# Patient Record
Sex: Male | Born: 1970 | Race: Black or African American | Hispanic: No | Marital: Married | Smoking: Former smoker
Health system: Southern US, Community
[De-identification: ages and names within clinical notes are randomized; demographics above are authoritative.]

## PROBLEM LIST (undated history)

## (undated) DIAGNOSIS — Z789 Other specified health status: Secondary | ICD-10-CM

## (undated) DIAGNOSIS — I10 Essential (primary) hypertension: Secondary | ICD-10-CM

## (undated) HISTORY — DX: Essential (primary) hypertension: I10

## (undated) HISTORY — PX: NO PAST SURGERIES: SHX2092

---

## 2006-11-13 ENCOUNTER — Emergency Department: Payer: Self-pay | Admitting: Emergency Medicine

## 2008-11-07 ENCOUNTER — Emergency Department: Payer: Self-pay | Admitting: Emergency Medicine

## 2008-11-12 ENCOUNTER — Emergency Department: Payer: Self-pay | Admitting: Emergency Medicine

## 2008-11-18 ENCOUNTER — Emergency Department: Payer: Self-pay | Admitting: Emergency Medicine

## 2009-04-09 ENCOUNTER — Emergency Department: Payer: Self-pay | Admitting: Emergency Medicine

## 2010-05-26 ENCOUNTER — Emergency Department: Payer: Self-pay | Admitting: Emergency Medicine

## 2014-02-07 ENCOUNTER — Emergency Department: Payer: Self-pay | Admitting: Emergency Medicine

## 2015-10-29 ENCOUNTER — Encounter (HOSPITAL_COMMUNITY): Payer: Self-pay | Admitting: *Deleted

## 2015-10-29 ENCOUNTER — Emergency Department (HOSPITAL_COMMUNITY)
Admission: EM | Admit: 2015-10-29 | Discharge: 2015-10-29 | Disposition: A | Discharge status: Home / Self Care | Payer: Self-pay | Attending: Emergency Medicine | Admitting: Emergency Medicine

## 2015-10-29 ENCOUNTER — Emergency Department (HOSPITAL_COMMUNITY): Payer: Self-pay

## 2015-10-29 DIAGNOSIS — S39012A Strain of muscle, fascia and tendon of lower back, initial encounter: Secondary | ICD-10-CM | POA: Insufficient documentation

## 2015-10-29 DIAGNOSIS — Y998 Other external cause status: Secondary | ICD-10-CM | POA: Insufficient documentation

## 2015-10-29 DIAGNOSIS — X58XXXA Exposure to other specified factors, initial encounter: Secondary | ICD-10-CM | POA: Insufficient documentation

## 2015-10-29 DIAGNOSIS — Y9289 Other specified places as the place of occurrence of the external cause: Secondary | ICD-10-CM | POA: Insufficient documentation

## 2015-10-29 DIAGNOSIS — Y9301 Activity, walking, marching and hiking: Secondary | ICD-10-CM | POA: Insufficient documentation

## 2015-10-29 DIAGNOSIS — F17210 Nicotine dependence, cigarettes, uncomplicated: Secondary | ICD-10-CM | POA: Insufficient documentation

## 2015-10-29 LAB — URINALYSIS, ROUTINE W REFLEX MICROSCOPIC
BILIRUBIN URINE: NEGATIVE
Glucose, UA: NEGATIVE mg/dL
Hgb urine dipstick: NEGATIVE
Ketones, ur: NEGATIVE mg/dL
Leukocytes, UA: NEGATIVE
NITRITE: NEGATIVE
Protein, ur: NEGATIVE mg/dL
SPECIFIC GRAVITY, URINE: 1.023 (ref 1.005–1.030)
pH: 6 (ref 5.0–8.0)

## 2015-10-29 MED ORDER — OXYCODONE-ACETAMINOPHEN 5-325 MG PO TABS
1.0000 | ORAL_TABLET | Freq: Once | ORAL | Status: AC
  Administered 2015-10-29: 1 via ORAL
  Filled 2015-10-29: qty 1

## 2015-10-29 MED ORDER — HYDROCODONE-ACETAMINOPHEN 5-325 MG PO TABS: 1.0000 | ORAL_TABLET | Freq: Four times a day (QID) | ORAL | Status: DC | PRN

## 2015-10-29 MED ORDER — METHOCARBAMOL 500 MG PO TABS: 500.0000 mg | ORAL_TABLET | Freq: Two times a day (BID) | ORAL | Status: DC

## 2015-10-29 MED ORDER — KETOROLAC TROMETHAMINE 60 MG/2ML IM SOLN
60.0000 mg | Freq: Once | INTRAMUSCULAR | Status: AC
  Administered 2015-10-29: 60 mg via INTRAMUSCULAR
  Filled 2015-10-29: qty 2

## 2015-10-29 MED ORDER — PREDNISONE 20 MG PO TABS: 40.0000 mg | ORAL_TABLET | Freq: Every day | ORAL | Status: DC

## 2015-10-29 NOTE — Discharge Instructions (Signed)
Take ibuprofen or tylenol for minor pain. norco for severe pain. Prednisone for inflammation until all gone. Robaxin for spasms. Try heating pads. Gentle stretches. Follow up with primary care doctor.    Back Exercises The following exercises strengthen the muscles that help to support the back. They also help to keep the lower back flexible. Doing these exercises can help to prevent back pain or lessen existing pain. If you have back pain or discomfort, try doing these exercises 2-3 times each day or as told by your health care provider. When the pain goes away, do them once each day, but increase the number of times that you repeat the steps for each exercise (do more repetitions). If you do not have back pain or discomfort, do these exercises once each day or as told by your health care provider. EXERCISES Single Knee to Chest Repeat these steps 3-5 times for each leg: 1. Lie on your back on a firm bed or the floor with your legs extended. 2. Bring one knee to your chest. Your other leg should stay extended and in contact with the floor. 3. Hold your knee in place by grabbing your knee or thigh. 4. Pull on your knee until you feel a gentle stretch in your lower back. 5. Hold the stretch for 10-30 seconds. 6. Slowly release and straighten your leg. Pelvic Tilt Repeat these steps 5-10 times: 1. Lie on your back on a firm bed or the floor with your legs extended. 2. Bend your knees so they are pointing toward the ceiling and your feet are flat on the floor. 3. Tighten your lower abdominal muscles to press your lower back against the floor. This motion will tilt your pelvis so your tailbone points up toward the ceiling instead of pointing to your feet or the floor. 4. With gentle tension and even breathing, hold this position for 5-10 seconds. Cat-Cow Repeat these steps until your lower back becomes more flexible: 1. Get into a hands-and-knees position on a firm surface. Keep your hands under  your shoulders, and keep your knees under your hips. You may place padding under your knees for comfort. 2. Let your head hang down, and point your tailbone toward the floor so your lower back becomes rounded like the back of a cat. 3. Hold this position for 5 seconds. 4. Slowly lift your head and point your tailbone up toward the ceiling so your back forms a sagging arch like the back of a cow. 5. Hold this position for 5 seconds. Press-Ups Repeat these steps 5-10 times: 1. Lie on your abdomen (face-down) on the floor. 2. Place your palms near your head, about shoulder-width apart. 3. While you keep your back as relaxed as possible and keep your hips on the floor, slowly straighten your arms to raise the top half of your body and lift your shoulders. Do not use your back muscles to raise your upper torso. You may adjust the placement of your hands to make yourself more comfortable. 4. Hold this position for 5 seconds while you keep your back relaxed. 5. Slowly return to lying flat on the floor. Bridges Repeat these steps 10 times: 1. Lie on your back on a firm surface. 2. Bend your knees so they are pointing toward the ceiling and your feet are flat on the floor. 3. Tighten your buttocks muscles and lift your buttocks off of the floor until your waist is at almost the same height as your knees. You should feel the  muscles working in your buttocks and the back of your thighs. If you do not feel these muscles, slide your feet 1-2 inches farther away from your buttocks. 4. Hold this position for 3-5 seconds. 5. Slowly lower your hips to the starting position, and allow your buttocks muscles to relax completely. If this exercise is too easy, try doing it with your arms crossed over your chest. Abdominal Crunches Repeat these steps 5-10 times: 1. Lie on your back on a firm bed or the floor with your legs extended. 2. Bend your knees so they are pointing toward the ceiling and your feet are flat on  the floor. 3. Cross your arms over your chest. 4. Tip your chin slightly toward your chest without bending your neck. 5. Tighten your abdominal muscles and slowly raise your trunk (torso) high enough to lift your shoulder blades a tiny bit off of the floor. Avoid raising your torso higher than that, because it can put too much stress on your low back and it does not help to strengthen your abdominal muscles. 6. Slowly return to your starting position. Back Lifts Repeat these steps 5-10 times: 1. Lie on your abdomen (face-down) with your arms at your sides, and rest your forehead on the floor. 2. Tighten the muscles in your legs and your buttocks. 3. Slowly lift your chest off of the floor while you keep your hips pressed to the floor. Keep the back of your head in line with the curve in your back. Your eyes should be looking at the floor. 4. Hold this position for 3-5 seconds. 5. Slowly return to your starting position. SEEK MEDICAL CARE IF:  Your back pain or discomfort gets much worse when you do an exercise.  Your back pain or discomfort does not lessen within 2 hours after you exercise. If you have any of these problems, stop doing these exercises right away. Do not do them again unless your health care provider says that you can. SEEK IMMEDIATE MEDICAL CARE IF:  You develop sudden, severe back pain. If this happens, stop doing the exercises right away. Do not do them again unless your health care provider says that you can.   This information is not intended to replace advice given to you by your health care provider. Make sure you discuss any questions you have with your health care provider.   Document Released: 10/25/2004 Document Revised: 06/08/2015 Document Reviewed: 11/11/2014 Elsevier Interactive Patient Education 2016 [employer].   Emergency Department Resource Guide 1) Find a Doctor and Pay Out of Pocket Although you won't have to find out who is covered by your  insurance plan, it is a good idea to ask around and get recommendations. You will then need to call the office and see if the doctor you have chosen will accept you as a new patient and what types of options they offer for patients who are self-pay. Some doctors offer discounts or will set up payment plans for their patients who do not have insurance, but you will need to ask so you aren't surprised when you get to your appointment.  2) Contact Your Local Health Department Not all health departments have doctors that can see patients for sick visits, but many do, so it is worth a call to see if yours does. If you don't know where your local health department is, you can check in your phone book. The CDC also has a tool to help you locate your state's health department, and  many state websites also have listings of all of their local health departments.  3) Find a Walk-in Clinic If your illness is not likely to be very severe or complicated, you may want to try a walk in clinic. These are popping up all over the country in pharmacies, drugstores, and shopping centers. They're usually staffed by nurse practitioners or physician assistants that have been trained to treat common illnesses and complaints. They're usually fairly quick and inexpensive. However, if you have serious medical issues or chronic medical problems, these are probably not your best option.  No Primary Care Doctor: - Call Health Connect at  [phone] - they can help you locate a primary care doctor that  accepts your insurance, provides certain services, etc. - Physician Referral Service- [phone]  Chronic Pain Problems: Organization         Address  Phone   Notes  [facility]  [phone] Patients need to be referred by their primary care doctor.   Medication Assistance: Organization         Address  Phone   Notes  [facility] Medication [facility] [address] [place] [place], [place] [address] [phone] --Must be a resident of [facility] -- Must have NO insurance coverage whatsoever (no Medicaid/ Medicare, etc.) -- The pt. MUST have a primary care doctor that directs their care regularly and follows them in the community   Med[facility]ist  [phone]   [employer]  [phone]    Agencies that provide inexpensive medical care: Organization         Address  Phone   Notes  [facility]  [phone]   [facility]    [phone]   [facility] [address] [place], [place] [address] [phone]   [facility] [address] [place] [place] [phone]   Planned Parenthood    [phone]   [facility]    [phone]   [facility]  [address], [place] Phone:  [phone], Fax:  [phone] Hours of Operation:  9 am - 6 pm, M-F.  Also accepts Medicaid/Medicare and self-pay.  [facility]  [address], [place] Phone: [phone], Fax: [phone]. Hours of Operation:  8:30 am - 5:30 pm, M-F.  Also accepts Medicaid and self-pay.  [facility] [place] [address], [place] Phone: [phone]   [facility] [address] Natasha Bence [place], [place] [phone], Ext. [phone] Mondays & Thursdays: 7-9 AM.  First 15 patients are seen on a first come, first serve basis.    Medicaid-accepting [facility] Providers:  Organization         Address  Phone   Notes  [facility] [address], [place] [phone] Also accepts self-pay patients.  [facility] [address] Laurell Josephs [place], [place]  [phone]   [facility] [address], [place] [phone]   [facility] [address], [place] [phone]   Renaye Rakers [address], [place]   [phone] Only accepts [place] patients after they have their name applied to their card.   Self-Pay (no insurance) in [facility]:  Organization  Address  Phone   Notes  Sickle Cell Patients, [facility] [address] [place], [place] [phone]   [facility] [address] [place], [place] [phone]   [facility] [place]  [address], [place] [phone]   [facility]/Dr. Osei-Bonsu  [address] [place] or [address], [place] [phone] Phone number for both [place] and [place] locations is the same.  [facility] [address], [place] [phone]   [facility] [address] [place] or [address] [phone]   [facility] [address], [place] [phone], phone; [phone], fax Sees patients 1st and 3rd Saturday of every month.  Must not qualify for public or private insurance (i.e. Medicaid, Medicare, [place] Health Choice, Veterans' Benefits)  Household income should be no more than 200% of the poverty level The clinic cannot treat you if you are pregnant or think you are pregnant  Sexually transmitted diseases are not treated at the clinic.    Dental Care: Organization         Address  Phone  Notes  [facility] [address] [place], [place] [phone] Accepts children up to age 3 who are enrolled in [place] or [place] Health Choice; pregnant women with a Medicaid card; and children who have applied for Medicaid or [place] Health Choice, but were declined, whose parents can pay a reduced fee at time of service.  [facility]  [address], [place] [phone] Accepts children up to age 61 who are enrolled  in [place] or [place] Health Choice; pregnant women with a Medicaid card; and children who have applied for Medicaid or [place] Health Choice, but were declined, whose parents can pay a reduced fee at time of service.  [facility]  [address] [place], [place] [phone] Patients are seen by appointment only. Walk-ins are not accepted. [facility] will see patients 69 years of age and older. Monday - Tuesday (8am-5pm) Most Wednesdays (8:30-5pm) $30 per visit, cash only  [facility]  [address], [facility] [phone] Patients are seen by appointment only. Walk-ins are not accepted. [facility] will see patients 88 years of age and older. One Wednesday Evening (Monthly: Volunteer Based).  $30 per visit, cash only  [employer]  [phone] for adults; Children under age 53, call Graduate Pediatric Dentistry at [phone]. Children aged 65-14, please call [phone] to request a pediatric application.  Dental services are provided in all areas of dental care including fillings, crowns and bridges, complete and partial dentures, implants, gum treatment, root canals, and extractions. Preventive care is also provided. Treatment is provided to both adults and children. Patients are selected via a lottery and there is often a waiting list.   [facility] [address], [place]  [phone] [email or web address]   [facility] [address] [place], [place] [phone], Ext. [phone] Second and Fourth Thursday of each month, opens at 6:30 AM; Clinic ends at 9 AM.  Patients are seen on a first-come first-served basis, and a limited number are seen during each clinic.   [facility]  [address] [place],  [place] [phone]   Eligibility Requirements You must have lived in [place], [place], or [place] counties for at least the last three months.   You cannot be  eligible for state or federal sponsored [place]healthcare insurance, including CIGNAVeterans Administration, [place]icaid, or [employer]icare.   You generally cannot be eligible for healthcare insurance through your employer.    How to apply: Eligibility screenings are held every Tuesday and Wednesday afternoon from 1:00 pm until 4:00 pm. You do not need an appointment for the interview!  [facility] [address], [place], [place] [phone]   [facility]  [phone]   [facility]  [phone]   [facility]  [phone]    Behavioral Health Resources in the Community: Intensive Outpatient Programs Organization         Address  Phone  Notes  [facility] [address], [place], [place] [phone]   [facility] [address], [place], [place] [phone]   [facility] [address], [place], [place]  [phone]   [facility] [address],  [place], [place] [phone] or [phone]   Substance Abuse Resources Organization         Address  Phone  Notes  [facility]  [phone]   [facility]  [phone]   [facility]  [phone]   [facility]  [phone]   Residential & Outpatient Substance Abuse Program  [phone]   Psychological Services Organization         Address  Phone  Notes  [facility]  [phone]   [facility]  [phone]- [phone]   [facility] [address], [place] [phone] or [phone]    Mobile Crisis Teams Organization         Address  Phone  Notes  [facility], Mobile Crisis Care Unit  [phone]   [facility]  [address] [place], [place] [phone]   Doristine LocksSharon DeEsch [address] [place] [phone]    Self-Help/Support Groups Organization          Address  Phone             Notes  [facility] [place] - variety of support groups  [phone] Call for more information  Narcotics Anonymous (NA), [facility] [address], [place]  2 meetings at this location   Statisticianesidential Treatment Programs Organization         Address  Phone  Notes  [facility] [address],    [place]  [phone]   [facility]  [address], [place] [phone], [place], [place] [phone]   [facility] [address], [place] [phone] Admissions: 8am-3pm M-F  [facility] [address]    [place], [place] [phone]   [facility] [address], [place], [place] [phone]   [facility] [address]  [place], [place] [phone]   [facility] - Intensive Outpatient [address], [place], [place] [phone]   [facility] [address]  [place], [place] [phone] or [phone]   [facility] ([facility]) [address] [place], [place] [phone] Accepts Medicaid  [facility] [address]  [place] [phone] Substance Abuse/Addiction Treatment   [facility] Resources Organization  Address  Phone  Notes  [facility]  [phone]   Angie Fava, PhD [address] Ervin Knack [place], [place]   [phone] or [phone]   [facility]   [address] [place], [place] [phone]   [facility] [address] [place], [place] [phone] Insurance/Medicaid/sponsorship through [facility] and Families [address]                                    [place], [place] [phone] Therapy/tele-psych/case  [facility] [address], [place] [phone]    Dr. Lolly Mustache  [phone]   [facility]  [employer]  [facility] Dept. 1) [address], [place] 2) [address] [place] 3)  [address], [place] [phone]  [phone]   [facility] Child Abuse Hotline [phone] or [phone] (After Hours)

## 2015-10-29 NOTE — ED Notes (Signed)
Pt reports onset today of mid lower back pain, increases with movement. Denies injury, denies any urinary symptoms. Ambulatory at triage.

## 2015-10-29 NOTE — ED Provider Notes (Signed)
CSN: [id number]     Arrival date & time 10/29/15  1734 History   First MD Initiated Contact with Patient 10/29/15 1740     Chief Complaint  Patient presents with  . Back Pain     (Consider location/radiation/quality/duration/timing/severity/associated sxs/prior Treatment) HPI Kyle Mathis is a 45 y.o. male with no medical problems, presents to emergency department with acute onset of lower back pain. Patient states he is walking and leaned over to pick something off the floor when he suddenly developed severe pain in the lower back. States pain radiates into the right side of the back. States pain is worsened with movement and walking. He states that nothing makes it better. He denies any prior back issues or prior similar back pain. He denies pain radiating to his abdomen or down his extremities. He denies any abdominal pain, nausea, vomiting. He denies any dysuria, hematuria, urgency or frequency. He denies any numbness or weakness in his legs or arms. No trouble controlling his bowels or bladder. No fever. He took some ibuprofen at home but states it did not help.  History reviewed. No pertinent past medical history. History reviewed. No pertinent past surgical history. History reviewed. No pertinent family history. Social History  Substance Use Topics  . Smoking status: Current Every Day Smoker    Types: Cigarettes  . Smokeless tobacco: None  . Alcohol Use: No    Review of Systems  Constitutional: Negative for fever and chills.  Respiratory: Negative for cough, chest tightness and shortness of breath.   Cardiovascular: Negative for chest pain, palpitations and leg swelling.  Gastrointestinal: Negative for nausea, vomiting, abdominal pain, diarrhea and abdominal distention.  Genitourinary: Negative for dysuria, urgency, frequency and hematuria.  Musculoskeletal: Positive for back pain and arthralgias. Negative for myalgias, gait problem, neck pain and neck stiffness.  Skin:  Negative for rash.  Allergic/Immunologic: Negative for immunocompromised state.  Neurological: Negative for dizziness, weakness, light-headedness, numbness and headaches.  All other systems reviewed and are negative.     Allergies  Review of patient's allergies indicates no known allergies.  Home Medications   Prior to Admission medications   Not on File   BP 143/87 mmHg  Pulse 85  Temp(Src) 97.8 F (36.6 C) (Oral)  Resp 18  SpO2 97% Physical Exam  Constitutional: He is oriented to person, place, and time. He appears well-developed and well-nourished. No distress.  HENT:  Head: Normocephalic and atraumatic.  Eyes: Conjunctivae are normal.  Neck: Neck supple.  Cardiovascular: Normal rate, regular rhythm and normal heart sounds.   Pulmonary/Chest: Effort normal. No respiratory distress. He has no wheezes. He has no rales.  Abdominal: Soft. Bowel sounds are normal. He exhibits no distension. There is no tenderness. There is no rebound.  Musculoskeletal: He exhibits no edema.  Midline lumbar spine tenderness. The right paravertebral tenderness. Pain with right straight leg raise.  Neurological: He is alert and oriented to person, place, and time.  5/5 and equal lower extremity strength. 2+ and equal patellar reflexes bilaterally. Pt able to dorsiflex bilateral toes and feet with good strength against resistance. Equal sensation bilaterally over thighs and lower legs.   Skin: Skin is warm and dry.  Nursing note and vitals reviewed.   ED Course  Procedures (including critical care time) Labs Review Labs Reviewed  URINALYSIS, ROUTINE W REFLEX MICROSCOPIC (NOT AT [facility])    Imaging Review Dg Lumbar Spine Complete  10/29/2015  CLINICAL DATA:  Back injury and pain after bending over to pick up  object. Initial encounter. EXAM: LUMBAR SPINE - COMPLETE 4+ VIEW COMPARISON:  None. FINDINGS: There is no evidence of lumbar spine fracture. Alignment is normal. Moderate to severe  degenerative disc disease is seen at L4-5. Mild degenerative disc disease seen at L5-S1. Bilateral facet DJD also seen at these levels. No focal lytic sclerotic bone lesions identified. IMPRESSION: No acute findings.  Degenerative spondylosis, as described above. Electronically Signed   By: Myles Rosenthal M.D.   On: 10/29/2015 19:07   I have personally reviewed and evaluated these images and lab results as part of my medical decision-making.   EKG Interpretation None      MDM   Final diagnoses:  Lumbosacral strain, initial encounter    patient with lower back pain, acute onset today while leaning forward. Pain most likely musculoskeletal based on exam and history, however patient is concerned about his kidneys. We'll get urinalysis. Will also get x-ray to evaluate patient's spine for any degenerative changes or disc space narrowing.   X-ray showing degenerative spondylosis. Urinalysis is normal. Pt given toradol  IM and percocet for severe pain. Patient is feeling better. He is neurovascularly intact. No evidence of cauda equina. We'll try treating with prednisone, Norco, Robaxin. Follow with primary care doctor. Return precautions discussed.  Filed Vitals:   10/29/15 1739  BP: 143/87  Pulse: 85  Temp: 97.8 F (36.6 C)  TempSrc: Oral  Resp: 18  SpO2: 97%  \  Jaynie Crumble, PA-C 10/30/15 1449  Laurence Spates, MD 10/30/15 1556

## 2017-07-13 ENCOUNTER — Encounter (HOSPITAL_COMMUNITY): Payer: Self-pay | Admitting: *Deleted

## 2017-07-13 ENCOUNTER — Emergency Department (HOSPITAL_COMMUNITY)
Admission: EM | Admit: 2017-07-13 | Discharge: 2017-07-14 | Disposition: A | Discharge status: Home / Self Care | Payer: Self-pay | Attending: Emergency Medicine | Admitting: Emergency Medicine

## 2017-07-13 DIAGNOSIS — R066 Hiccough: Secondary | ICD-10-CM

## 2017-07-13 DIAGNOSIS — R197 Diarrhea, unspecified: Secondary | ICD-10-CM | POA: Insufficient documentation

## 2017-07-13 DIAGNOSIS — R112 Nausea with vomiting, unspecified: Secondary | ICD-10-CM | POA: Insufficient documentation

## 2017-07-13 DIAGNOSIS — Z87891 Personal history of nicotine dependence: Secondary | ICD-10-CM | POA: Insufficient documentation

## 2017-07-13 MED ORDER — SODIUM CHLORIDE 0.9 % IV SOLN
25.0000 mg | Freq: Once | INTRAVENOUS | Status: AC
  Administered 2017-07-14: 25 mg via INTRAVENOUS
  Filled 2017-07-13: qty 1

## 2017-07-13 MED ORDER — SODIUM CHLORIDE 0.9 % IV BOLUS (SEPSIS)
500.0000 mL | Freq: Once | INTRAVENOUS | Status: AC
  Administered 2017-07-14: 500 mL via INTRAVENOUS

## 2017-07-13 NOTE — ED Triage Notes (Signed)
Pt c/o hiccups, sob, throat irritation, n/v/d, chills,  for the past 3 days, pt had recent flight from [place] to [place] 3 days ago.

## 2017-07-14 ENCOUNTER — Emergency Department (HOSPITAL_COMMUNITY): Payer: Self-pay

## 2017-07-14 LAB — COMPREHENSIVE METABOLIC PANEL
ALBUMIN: 3.9 g/dL (ref 3.5–5.0)
ALK PHOS: 66 U/L (ref 38–126)
ALT: 24 U/L (ref 17–63)
AST: 30 U/L (ref 15–41)
Anion gap: 10 (ref 5–15)
BILIRUBIN TOTAL: 0.8 mg/dL (ref 0.3–1.2)
BUN: 9 mg/dL (ref 6–20)
CO2: 26 mmol/L (ref 22–32)
Calcium: 9.1 mg/dL (ref 8.9–10.3)
Chloride: 101 mmol/L (ref 101–111)
Creatinine, Ser: 1.15 mg/dL (ref 0.61–1.24)
GFR calc Af Amer: >60 mL/min (ref >60)
GLUCOSE: 106 mg/dL — AB (ref 65–99)
Potassium: 3.4 mmol/L — ABNORMAL LOW (ref 3.5–5.1)
Sodium: 137 mmol/L (ref 135–145)
TOTAL PROTEIN: 7.4 g/dL (ref 6.5–8.1)

## 2017-07-14 LAB — CBC WITH DIFFERENTIAL/PLATELET
BASOS ABS: 0 10*3/uL (ref 0.0–0.1)
BASOS PCT: 0 %
Eosinophils Absolute: 0.5 10*3/uL (ref 0.0–0.7)
Eosinophils Relative: 4 %
HEMATOCRIT: 36.5 % — AB (ref 39.0–52.0)
HEMOGLOBIN: 12.7 g/dL — AB (ref 13.0–17.0)
LYMPHS ABS: 3.3 10*3/uL (ref 0.7–4.0)
Lymphocytes Relative: 29 %
MCH: 32.9 pg (ref 26.0–34.0)
MCHC: 34.8 g/dL (ref 30.0–36.0)
MCV: 94.6 fL (ref 78.0–100.0)
MONO ABS: 1.2 10*3/uL — AB (ref 0.1–1.0)
Monocytes Relative: 10 %
NEUTROS PCT: 57 %
Neutro Abs: 6.4 10*3/uL (ref 1.7–7.7)
Platelets: 200 10*3/uL (ref 150–400)
RBC: 3.86 MIL/uL — ABNORMAL LOW (ref 4.22–5.81)
RDW: 12.3 % (ref 11.5–15.5)
WBC: 11.3 10*3/uL — AB (ref 4.0–10.5)

## 2017-07-14 LAB — LIPASE, BLOOD: LIPASE: 49 U/L (ref 11–51)

## 2017-07-14 LAB — I-STAT TROPONIN, ED: Troponin i, poc: 0 ng/mL (ref 0.00–0.08)

## 2017-07-14 MED ORDER — RANITIDINE HCL 150 MG PO TABS: 150.0000 mg | ORAL_TABLET | Freq: Two times a day (BID) | ORAL | 0 refills | Status: DC

## 2017-07-14 MED ORDER — CHLORPROMAZINE HCL 50 MG/2ML IJ SOLN
INTRAMUSCULAR | Status: AC
  Filled 2017-07-14: qty 2

## 2017-07-14 MED ORDER — ONDANSETRON HCL 4 MG PO TABS: 4.0000 mg | ORAL_TABLET | Freq: Four times a day (QID) | ORAL | 0 refills | Status: DC

## 2017-07-14 NOTE — ED Provider Notes (Signed)
[facility]-EMER[facility]ENCY DEPT Provider Note   CSN: [id number] Arrival date & time: 07/13/17  2311     History   Chief Complaint Chief Complaint  Patient presents with  . Hiccups    HPI Kyle Mathis is a 46 y.o. male.  Patient presents to the ER for evaluation of nausea, vomiting, diarrhea area.symptoms have been ongoing for 3 days. Symptoms began when he was in [place]. He reports that he ate a meal and then started to feel ill. He started having nausea and then developed vomiting and diarrhea. Since then he has been experiencing persistent hiccups. He has throat irritation and feels short of breath because of uncontrolled hiccuping. He is not experiencing any chest pain.      History reviewed. No pertinent past medical history.  There are no active problems to display for this patient.   History reviewed. No pertinent surgical history.     Home Medications    Prior to Admission medications   Medication Sig Start Date End Date Taking? Authorizing Provider  HYDROcodone-acetaminophen (NORCO) 5-325 MG tablet Take 1 tablet by mouth every 6 (six) hours as needed. 10/29/15   Kirichenko, Tatyana, PA-C  methocarbamol (ROBAXIN) 500 MG tablet Take 1 tablet (500 mg total) by mouth 2 (two) times daily. 10/29/15   Kirichenko, Lemont Fillers, PA-C  predniSONE (DELTASONE) 20 MG tablet Take 2 tablets (40 mg total) by mouth daily. 10/29/15   Jaynie Crumble, PA-C    Family History No family history on file.  Social History Social History  Substance Use Topics  . Smoking status: Former Smoker    Types: Cigarettes  . Smokeless tobacco: Never Used  . Alcohol use No     Allergies   Patient has no known allergies.   Review of Systems Review of Systems  Gastrointestinal: Positive for abdominal pain, diarrhea, nausea and vomiting.  All other systems reviewed and are negative.    Physical Exam Updated Vital Signs BP 131/71   Pulse 80   Temp 98.6 F (37 C) (Oral)   Resp 18   Ht   (1.753 m)   Wt 100.7 kg (222 lb)   SpO2 95%   BMI 32.78 kg/m   Physical Exam  Constitutional: He is oriented to person, place, and time. He appears well-developed and well-nourished. No distress.  HENT:  Head: Normocephalic and atraumatic.  Right Ear: Hearing normal.  Left Ear: Hearing normal.  Nose: Nose normal.  Mouth/Throat: Oropharynx is clear and moist and mucous membranes are normal.  Eyes: Pupils are equal, round, and reactive to light. Conjunctivae and EOM are normal.  Neck: Normal range of motion. Neck supple.  Cardiovascular: Regular rhythm, S1 normal and S2 normal.  Exam reveals no gallop and no friction rub.   No murmur heard. Pulmonary/Chest: Effort normal and breath sounds normal. No respiratory distress. He exhibits no tenderness.  Abdominal: Soft. Normal appearance and bowel sounds are normal. There is no hepatosplenomegaly. There is generalized tenderness. There is no rebound, no guarding, no tenderness at McBurney's point and negative Murphy's sign. No hernia.  Musculoskeletal: Normal range of motion.  Neurological: He is alert and oriented to person, place, and time. He has normal strength. No cranial nerve deficit or sensory deficit. Coordination normal. GCS eye subscore is 4. GCS verbal subscore is 5. GCS motor subscore is 6.  Skin: Skin is warm, dry and intact. No rash noted. No cyanosis.  Psychiatric: He has a normal mood and affect. His speech is normal and behavior is normal.  Thought content normal.  Nursing note and vitals reviewed.    ED Treatments / Results  Labs (all labs ordered are listed, but only abnormal results are displayed) Labs Reviewed  CBC WITH DIFFERENTIAL/PLATELET - Abnormal; Notable for the following:       Result Value   WBC 11.3 (*)    RBC 3.86 (*)    Hemoglobin 12.7 (*)    HCT 36.5 (*)    Monocytes Absolute 1.2 (*)    All other components within normal limits  COMPREHENSIVE METABOLIC PANEL - Abnormal; Notable for the  following:    Potassium 3.4 (*)    Glucose, Bld 106 (*)    All other components within normal limits  LIPASE, BLOOD  I-STAT TROPONIN, ED    EKG  EKG Interpretation  Date/Time:  Sunday July 14 2017 00:17:32 EDT Ventricular Rate:  85 PR Interval:    QRS Duration: 91 QT Interval:  358 QTC Calculation: 426 R Axis:   42 Text Interpretation:  Sinus rhythm Prolonged PR interval Probable anteroseptal infarct No significant change since last tracing Confirmed by Gilda Crease [phone]) on 07/14/2017 12:49:00 AM       Radiology Dg Abd Acute W/chest  Result Date: 07/14/2017 CLINICAL DATA:  Acute onset of nausea, vomiting, diarrhea and chills. Shortness of breath. Throat irritation. Initial encounter. EXAM: DG ABDOMEN ACUTE W/ 1V CHEST COMPARISON:  Lumbar spine radiographs performed 10/29/2015 FINDINGS: The lungs are hypoexpanded but appear grossly clear. There is no evidence of focal opacification, pleural effusion or pneumothorax. The cardiomediastinal silhouette is mildly enlarged. The visualized bowel gas pattern is unremarkable. Scattered stool and air are seen within the colon; there is no evidence of small bowel dilatation to suggest obstruction. No free intra-abdominal air is identified on the provided upright view. No acute osseous abnormalities are seen; the sacroiliac joints are unremarkable in appearance. IMPRESSION: 1. Unremarkable bowel gas pattern; no free intra-abdominal air seen. Small amount of stool noted in the colon. 2. Lungs hypoexpanded but grossly clear. 3. Mild cardiomegaly. Electronically Signed   By: Roanna Raider M.D.   On: 07/14/2017 04:29    Procedures Procedures (including critical care time)  Medications Ordered in ED Medications  sodium chloride 0.9 % bolus 500 mL (0 mLs Intravenous Stopped 07/14/17 0150)  chlorproMAZINE (THORAZINE) 25 mg in sodium chloride 0.9 % 25 mL IVPB (0 mg Intravenous Stopped 07/14/17 0150)     Initial Impression /  Assessment and Plan / ED Course  I have reviewed the triage vital signs and the nursing notes.  Pertinent labs & imaging results that were available during my care of the patient were reviewed by me and considered in my medical decision making (see chart for details).     Patient presented to the ER for evaluation of multiple problems. Patient began to have GI distress including nausea, vomiting, diarrhea well visiting in [place]. Once the GI symptoms began he started having hiccups and he has not been able to stop pickup since he started 3 days ago. He has not been able to eat or drink much because of his current symptoms. He has vomited multiple times and now his throat is irritated. He is not expressing chest pain or shortness of breath. No tachycardia, tachypnea, hypoxia. PERC negative, no signs of PE.  Patient had mild and diffuse abdominal tenderness, no focal tenderness. No guarding, no rebound.X-ray negative. Blood work reassuring. Patient treated with Thorazine and IV fluids. Hiccups resolved. Patient monitored for sedation after Thorazine, continues to  do well, appropriate for discharge with symptomatic treatment. Return if symptoms worsen.  Final Clinical Impressions(s) / ED Diagnoses   Final diagnoses:  Nausea vomiting and diarrhea  Hiccups    New Prescriptions New Prescriptions   No medications on file     Gilda Crease, MD 07/14/17 [phone]

## 2017-07-14 NOTE — ED Notes (Signed)
Pt and family updated,  

## 2017-07-14 NOTE — ED Notes (Signed)
Pt and family updated, pt drowsy, will arouse with stimulation,

## 2018-04-09 ENCOUNTER — Other Ambulatory Visit: Payer: Self-pay

## 2018-04-09 ENCOUNTER — Emergency Department: Payer: Self-pay

## 2018-04-09 ENCOUNTER — Emergency Department
Admission: EM | Admit: 2018-04-09 | Discharge: 2018-04-09 | Disposition: A | Discharge status: Home / Self Care | Payer: Self-pay | Attending: Emergency Medicine | Admitting: Emergency Medicine

## 2018-04-09 DIAGNOSIS — Z23 Encounter for immunization: Secondary | ICD-10-CM | POA: Insufficient documentation

## 2018-04-09 DIAGNOSIS — W25XXXA Contact with sharp glass, initial encounter: Secondary | ICD-10-CM | POA: Insufficient documentation

## 2018-04-09 DIAGNOSIS — Y999 Unspecified external cause status: Secondary | ICD-10-CM | POA: Insufficient documentation

## 2018-04-09 DIAGNOSIS — Z79899 Other long term (current) drug therapy: Secondary | ICD-10-CM | POA: Insufficient documentation

## 2018-04-09 DIAGNOSIS — S2191XA Laceration without foreign body of unspecified part of thorax, initial encounter: Secondary | ICD-10-CM

## 2018-04-09 DIAGNOSIS — Y939 Activity, unspecified: Secondary | ICD-10-CM | POA: Insufficient documentation

## 2018-04-09 DIAGNOSIS — S0501XA Injury of conjunctiva and corneal abrasion without foreign body, right eye, initial encounter: Secondary | ICD-10-CM | POA: Insufficient documentation

## 2018-04-09 DIAGNOSIS — S022XXA Fracture of nasal bones, initial encounter for closed fracture: Secondary | ICD-10-CM | POA: Insufficient documentation

## 2018-04-09 DIAGNOSIS — S0240EA Zygomatic fracture, right side, initial encounter for closed fracture: Secondary | ICD-10-CM | POA: Insufficient documentation

## 2018-04-09 DIAGNOSIS — Y929 Unspecified place or not applicable: Secondary | ICD-10-CM | POA: Insufficient documentation

## 2018-04-09 DIAGNOSIS — S21112A Laceration without foreign body of left front wall of thorax without penetration into thoracic cavity, initial encounter: Secondary | ICD-10-CM | POA: Insufficient documentation

## 2018-04-09 DIAGNOSIS — Z0471 Encounter for examination and observation following alleged adult physical abuse: Secondary | ICD-10-CM | POA: Insufficient documentation

## 2018-04-09 MED ORDER — TRAMADOL HCL 50 MG PO TABS: 50.0000 mg | ORAL_TABLET | Freq: Four times a day (QID) | ORAL | 0 refills | Status: DC | PRN

## 2018-04-09 MED ORDER — FLUORESCEIN SODIUM 1 MG OP STRP
1.0000 | ORAL_STRIP | Freq: Once | OPHTHALMIC | Status: AC
  Administered 2018-04-09: 1 via OPHTHALMIC
  Filled 2018-04-09: qty 1

## 2018-04-09 MED ORDER — TETRACAINE HCL 0.5 % OP SOLN
1.0000 [drp] | Freq: Once | OPHTHALMIC | Status: AC
  Administered 2018-04-09: 1 [drp] via OPHTHALMIC
  Filled 2018-04-09: qty 4

## 2018-04-09 MED ORDER — ERYTHROMYCIN 5 MG/GM OP OINT: 1.0000 "application " | TOPICAL_OINTMENT | Freq: Four times a day (QID) | OPHTHALMIC | 0 refills | Status: AC

## 2018-04-09 MED ORDER — TETANUS-DIPHTH-ACELL PERTUSSIS 5-2.5-18.5 LF-MCG/0.5 IM SUSP
0.5000 mL | Freq: Once | INTRAMUSCULAR | Status: AC
  Administered 2018-04-09: 0.5 mL via INTRAMUSCULAR
  Filled 2018-04-09: qty 0.5

## 2018-04-09 NOTE — Discharge Instructions (Signed)
As we discussed please follow up with Ophthalmology for your corneal abrasion and the ENT doctors for your facial fractures. Please seek medical attention for any high fevers, chest pain, shortness of breath, change in behavior, persistent vomiting, bloody stool or any other new or concerning symptoms.
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
## 2018-04-09 NOTE — ED Notes (Signed)
First Nurse Note: Patient to CT via WC.
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
## 2019-02-15 ENCOUNTER — Emergency Department: Payer: No Typology Code available for payment source

## 2019-02-15 ENCOUNTER — Encounter: Payer: Self-pay | Admitting: Emergency Medicine

## 2019-02-15 ENCOUNTER — Other Ambulatory Visit: Payer: Self-pay

## 2019-02-15 ENCOUNTER — Observation Stay
Admission: EM | Admit: 2019-02-15 | Discharge: 2019-02-16 | Disposition: A | Discharge status: Home / Self Care | Payer: No Typology Code available for payment source | Attending: Internal Medicine | Admitting: Internal Medicine

## 2019-02-15 DIAGNOSIS — Z87891 Personal history of nicotine dependence: Secondary | ICD-10-CM | POA: Insufficient documentation

## 2019-02-15 DIAGNOSIS — M79605 Pain in left leg: Secondary | ICD-10-CM | POA: Insufficient documentation

## 2019-02-15 DIAGNOSIS — M25572 Pain in left ankle and joints of left foot: Secondary | ICD-10-CM | POA: Insufficient documentation

## 2019-02-15 DIAGNOSIS — T1490XA Injury, unspecified, initial encounter: Secondary | ICD-10-CM

## 2019-02-15 DIAGNOSIS — J939 Pneumothorax, unspecified: Secondary | ICD-10-CM

## 2019-02-15 DIAGNOSIS — Z1159 Encounter for screening for other viral diseases: Secondary | ICD-10-CM | POA: Diagnosis not present

## 2019-02-15 DIAGNOSIS — I10 Essential (primary) hypertension: Secondary | ICD-10-CM | POA: Diagnosis not present

## 2019-02-15 DIAGNOSIS — S2232XA Fracture of one rib, left side, initial encounter for closed fracture: Secondary | ICD-10-CM | POA: Diagnosis not present

## 2019-02-15 DIAGNOSIS — M25512 Pain in left shoulder: Secondary | ICD-10-CM | POA: Diagnosis not present

## 2019-02-15 DIAGNOSIS — S270XXA Traumatic pneumothorax, initial encounter: Secondary | ICD-10-CM | POA: Diagnosis present

## 2019-02-15 DIAGNOSIS — S2242XA Multiple fractures of ribs, left side, initial encounter for closed fracture: Secondary | ICD-10-CM

## 2019-02-15 DIAGNOSIS — S2239XA Fracture of one rib, unspecified side, initial encounter for closed fracture: Secondary | ICD-10-CM | POA: Diagnosis present

## 2019-02-15 HISTORY — DX: Other specified health status: Z78.9

## 2019-02-15 LAB — SARS CORONAVIRUS 2 BY RT PCR (HOSPITAL ORDER, PERFORMED IN ~~LOC~~ HOSPITAL LAB): SARS Coronavirus 2: NEGATIVE

## 2019-02-15 MED ORDER — ACETAMINOPHEN 650 MG RE SUPP: 650.0000 mg | Freq: Four times a day (QID) | RECTAL | Status: DC | PRN

## 2019-02-15 MED ORDER — OXYCODONE HCL 5 MG PO TABS
5.0000 mg | ORAL_TABLET | ORAL | Status: DC | PRN
  Administered 2019-02-16 (×3): 5 mg via ORAL
  Filled 2019-02-15 (×3): qty 1

## 2019-02-15 MED ORDER — MORPHINE SULFATE (PF) 2 MG/ML IV SOLN
2.0000 mg | INTRAVENOUS | Status: DC | PRN
  Administered 2019-02-15: 2 mg via INTRAVENOUS
  Filled 2019-02-15: qty 1

## 2019-02-15 MED ORDER — ONDANSETRON HCL 4 MG/2ML IJ SOLN: 4.0000 mg | Freq: Four times a day (QID) | INTRAMUSCULAR | Status: DC | PRN

## 2019-02-15 MED ORDER — ONDANSETRON HCL 4 MG PO TABS: 4.0000 mg | ORAL_TABLET | Freq: Four times a day (QID) | ORAL | Status: DC | PRN

## 2019-02-15 MED ORDER — BACITRACIN-NEOMYCIN-POLYMYXIN 400-5-5000 EX OINT
TOPICAL_OINTMENT | CUTANEOUS | Status: DC | PRN
  Administered 2019-02-16: 1 via TOPICAL
  Filled 2019-02-15 (×2): qty 1

## 2019-02-15 MED ORDER — ACETAMINOPHEN 325 MG PO TABS: 650.0000 mg | ORAL_TABLET | Freq: Four times a day (QID) | ORAL | Status: DC | PRN

## 2019-02-15 MED ORDER — ONDANSETRON HCL 4 MG/2ML IJ SOLN
4.0000 mg | Freq: Once | INTRAMUSCULAR | Status: AC
  Administered 2019-02-15: 4 mg via INTRAVENOUS
  Filled 2019-02-15: qty 2

## 2019-02-15 MED ORDER — IOHEXOL 300 MG/ML  SOLN
100.0000 mL | Freq: Once | INTRAMUSCULAR | Status: AC | PRN
  Administered 2019-02-15: 100 mL via INTRAVENOUS

## 2019-02-15 MED ORDER — ENOXAPARIN SODIUM 40 MG/0.4ML ~~LOC~~ SOLN: 40.0000 mg | SUBCUTANEOUS | Status: DC

## 2019-02-15 MED ORDER — MORPHINE SULFATE (PF) 4 MG/ML IV SOLN
6.0000 mg | Freq: Once | INTRAVENOUS | Status: AC
  Administered 2019-02-15: 20:00:00 6 mg via INTRAVENOUS
  Filled 2019-02-15: qty 2
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
## 2019-02-15 NOTE — ED Notes (Signed)
Report called - pt can go up after blood draw

## 2019-02-15 NOTE — ED Notes (Signed)
Pt refusing blood draw from lab. Dr. Bevely Palmer aware.

## 2019-02-15 NOTE — ED Notes (Signed)
Attempted for blood - unable to obtain. Lab will collect
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
## 2019-02-16 ENCOUNTER — Other Ambulatory Visit: Payer: Self-pay

## 2019-02-16 ENCOUNTER — Observation Stay: Payer: No Typology Code available for payment source

## 2019-02-16 DIAGNOSIS — J939 Pneumothorax, unspecified: Secondary | ICD-10-CM

## 2019-02-16 LAB — CBC
HCT: 41.8 % (ref 39.0–52.0)
Hemoglobin: 14.2 g/dL (ref 13.0–17.0)
MCH: 33.6 pg (ref 26.0–34.0)
MCHC: 34 g/dL (ref 30.0–36.0)
MCV: 98.8 fL (ref 80.0–100.0)
Platelets: 220 10*3/uL (ref 150–400)
RBC: 4.23 MIL/uL (ref 4.22–5.81)
RDW: 12.1 % (ref 11.5–15.5)
WBC: 16.3 10*3/uL — ABNORMAL HIGH (ref 4.0–10.5)
nRBC: 0 % (ref 0.0–0.2)

## 2019-02-16 LAB — CREATININE, SERUM
Creatinine, Ser: 1.22 mg/dL (ref 0.61–1.24)
GFR calc Af Amer: >60 mL/min (ref >60)
GFR calc non Af Amer: >60 mL/min (ref >60)

## 2019-02-16 MED ORDER — CLONIDINE HCL 0.1 MG PO TABS
0.2000 mg | ORAL_TABLET | Freq: Once | ORAL | Status: AC
  Administered 2019-02-16: 0.2 mg via ORAL
  Filled 2019-02-16: qty 2

## 2019-02-16 MED ORDER — ENOXAPARIN SODIUM 40 MG/0.4ML ~~LOC~~ SOLN
40.0000 mg | SUBCUTANEOUS | Status: DC
  Administered 2019-02-16: 40 mg via SUBCUTANEOUS
  Filled 2019-02-16: qty 0.4

## 2019-02-16 MED ORDER — OXYCODONE HCL 5 MG PO TABS: 5.0000 mg | ORAL_TABLET | Freq: Four times a day (QID) | ORAL | 0 refills | Status: DC | PRN

## 2019-02-16 MED ORDER — HYDRALAZINE HCL 25 MG PO TABS: 25.0000 mg | ORAL_TABLET | Freq: Three times a day (TID) | ORAL | 0 refills | Status: DC

## 2019-02-16 MED ORDER — METOPROLOL TARTRATE 25 MG PO TABS: 25.0000 mg | ORAL_TABLET | Freq: Two times a day (BID) | ORAL | 11 refills | Status: DC
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
## 2019-02-16 NOTE — Progress Notes (Signed)
[facility] [place] at [facility]    Kyle Mathis was admitted to the Hospital on 02/15/2019 and Discharged  02/16/2019 and should be excused from work/school for 1 week for days starting 02/15/2019 to 02/21/2019, may return to work/school without any restrictions.  Call [name] MD with questions.  [name] M.D on 02/16/2019,at 1:14 PM  [facility] [place] at [facility]  [phone]

## 2019-02-16 NOTE — Progress Notes (Signed)
Pt given incentive spirometry and instructed in use. Pt verbalizes understanding.

## 2019-02-16 NOTE — Discharge Instructions (Signed)
Blunt Chest Trauma Blunt chest trauma is an injury that is caused by a hard, direct hit (blow) to the chest. The blow can be strong enough to injure multiple body parts and organs. A blunt trauma does not involve a puncture of the skin. Blunt chest trauma often results in bruised or broken (fractured) ribs. In many cases, the soft tissue in the chest wall is also injured, and that damage causes pain and bruising. Internal organs, such as the heart and lungs, can be injured as well. Blunt chest trauma can lead to serious medical problems. This injury requires immediate medical care. What are the causes? This condition may be caused by:  Motor vehicle collisions.  Falls.  Physical violence.  Sports injuries. What are the signs or symptoms? Symptoms of this condition include:  Chest pain. The pain may be worse when you breathe deeply or move.  Shortness of breath.  Light-headedness.  Bruising.  Tenderness.  Swelling.  Drowsiness or confusion.  Cold and clammy skin.  Feeling as if your heart is racing. How is this diagnosed? This condition is diagnosed based on your medical history and a physical exam. You may also have imaging tests, including:  X-ray.  CT scan.  MRI.  Ultrasound. How is this treated? Treatment for this condition depends on what type of injury you have and how severe it is. You may need to stay in the hospital. Treatment may include:  Pain medicine. You may be given pain medicine through an IV at first. You may also be given over-the-counter and prescription pain medicines.  Tubes or other devices to help you breathe (like an incentive spirometer).  Inserting a tube into your chest to drain excess fluid, blood, or air.  Fluid or blood transfusions through an IV.  Surgery to repair broken ribs and fix damaged tissue and organs.  Lung (pulmonary) rehabilitation. This may include exercises, counseling, or support groups. Follow these instructions  at home: Activity   Do not lift anything that is heavier than 10 lb (4.5 kg), or the limit that you are told, until your health care provider says that it is safe.  Limit your activity as told by your health care provider. Ask your health care provider what activities are safe for you.  Do exercises as told by your health care provider. Medicines  Take over-the-counter and prescription medicines only as told by your health care provider.  Do not drive or use heavy machinery while taking prescription pain medicine.  If you are taking prescription pain medicine, take actions to prevent or treat constipation. Your health care provider may recommend that you: ? Drink enough fluid to keep your urine pale yellow. ? Eat foods that are high in fiber, such as fresh fruits and vegetables, whole grains, and beans. ? Limit foods that are high in fat and processed sugars, such as fried or sweet foods. ? Take an over-the-counter or prescription medicine for constipation. General instructions   If directed, apply ice to the injured area: ? Put ice in a plastic bag. ? Place a towel between your skin and the bag. ? Leave the ice on for 20 minutes, 2-3 times a day.  Do not use any products that contain nicotine or tobacco, such as cigarettes and e-cigarettes. These may delay healing after an injury. If you need help quitting, ask your health care provider.  Use your incentive spirometer as directed to help you take deep breaths.  Consider joining a support group. This may help you  learn about your condition and techniques to help with recovery.  Keep all follow-up visits as told by your health care provider. This is important. Follow-up visits may include counseling. Contact a health care provider if:  Your pain gets worse.  You develop a cough or wheezing. Get help right away if:  You have any of these: ? Shortness of breath. ? Pain in your abdomen. ? Nausea or vomiting. ? A fever or  chills. ? A rapid heart rate.  You cough up blood.  You feel dizzy or weak.  You faint.  You have severe chest pain. This may come with other symptoms, such as: ? Dizziness. ? Shortness of breath. ? Pain in your neck, jaw, or back, or in one arm or both arms. These symptoms may represent a serious problem that is an emergency. Do not wait to see if the symptoms will go away. Get medical help right away. Call your local emergency services (911 in the [place] Do not drive yourself to the hospital. Summary  Blunt chest trauma is an injury that is caused by a hard, direct hit (blow) to the chest. It may involve broken ribs, damage to the chest wall, and injury to internal organs such as the heart and lungs.  Blunt chest trauma can lead to serious medical problems. This injury requires immediate medical care.  Symptoms may include chest pain when moving or taking deep breaths, shortness of breath, bruising or swelling of the chest, faster heartbeat, light-headedness, or drowsiness.  Treatment for this condition depends on what type of injury you have and how severe it is.  Make sure you know which symptoms should cause you to get help right away. This information is not intended to replace advice given to you by your health care provider. Make sure you discuss any questions you have with your health care provider. Document Released: 10/25/2004 Document Revised: 08/07/2017 Document Reviewed: 08/07/2017 Elsevier Interactive Patient Education  2019 [employer]   Pneumothorax A pneumothorax is commonly called a collapsed lung. It is a condition in which air leaks from a lung and builds up between the thin layer of tissue that covers the lungs (visceral pleura) and the interior wall of the chest cavity (parietal pleura). The air gets trapped outside the lung, between the lung and the chest wall (pleural space). The air takes up space and prevents the lung from fully expanding. This condition  sometimes occurs suddenly with no apparent cause. The buildup of air may be small or large. A small pneumothorax may go away on its own. A large pneumothorax will require treatment and hospitalization. What are the causes? This condition may be caused by:  Trauma and injury to the chest wall.  Surgery and other medical procedures.  A complication of an underlying lung problem, especially chronic obstructive pulmonary disease (COPD) or emphysema. Sometimes the cause of this condition is not known. What increases the risk? You are more likely to develop this condition if:  You have an underlying lung problem.  You smoke.  You are 1-37 years old, male, tall, and underweight.  You have a personal or family history of pneumothorax.  You have an eating disorder (anorexia nervosa). This condition can also happen quickly, even in people with no history of lung problems. What are the signs or symptoms? Sometimes a pneumothorax will have no symptoms. When symptoms are present, they can include:  Chest pain.  Shortness of breath.  Increased rate of breathing.  Bluish color to your  lips or skin (cyanosis). How is this diagnosed? This condition may be diagnosed by:  A medical history and physical exam.  A chest X-ray, chest CT scan, or ultrasound. How is this treated? Treatment depends on how severe your condition is. The goal of treatment is to remove the extra air and allow your lung to expand back to its normal size.  For a small pneumothorax: ? No treatment may be needed. ? Extra oxygen is sometimes used to make it go away more quickly.  For a large pneumothorax or a pneumothorax that is causing symptoms, a procedure is done to drain the air from your lungs. To do this, a health care provider may use: ? A needle with a syringe. This is used to suck air from a pleural space where no additional leakage is taking place. ? A chest tube. This is used to suck air where there is  ongoing leakage into the pleural space. The chest tube may need to remain in place for several days until the air leak has healed.  In more severe cases, surgery may be needed to repair the damage that is causing the leak.  If you have multiple pneumothorax episodes or have an air leak that will not heal, a procedure called a pleurodesis may be done. A medicine is placed in the pleural space to irritate the tissues around the lung so that the lung will stick to the chest wall, seal any leaks, and stop any buildup of air in that space. If you have an underlying lung problem, severe symptoms, or a large pneumothorax you will usually need to stay in the hospital. Follow these instructions at home: Lifestyle  Do not use any products that contain nicotine or tobacco, such as cigarettes and e-cigarettes. These are major risk factors in pneumothorax. If you need help quitting, ask your health care provider.  Do not lift anything that is heavier than 10 lb (4.5 kg), or the limit that your health care provider tells you, until he or she says that it is safe.  Avoid activities that take a lot of effort (strenuous) for as long as told by your health care provider.  Return to your normal activities as told by your health care provider. Ask your health care provider what activities are safe for you.  Do not fly in an airplane or scuba dive until your health care provider says it is okay. General instructions  Take over-the-counter and prescription medicines only as told by your health care provider.  If a cough or pain makes it difficult for you to sleep at night, try sleeping in a semi-upright position in a recliner or by using 2 or 3 pillows.  If you had a chest tube and it was removed, ask your health care provider when you can remove the bandage (dressing). While the dressing is in place, do not allow it to get wet.  Keep all follow-up visits as told by your health care provider. This is  important. Contact a health care provider if:  You cough up thick mucus (sputum) that is yellow or green in color.  You were treated with a chest tube, and you have redness, increasing pain, or discharge at the site where it was placed. Get help right away if:  You have increasing chest pain or shortness of breath.  You have a cough that will not go away.  You begin coughing up blood.  You have pain that is getting worse or is not controlled  with medicines.  The site where your chest tube was located opens up.  You feel air coming out of the site where the chest tube was placed.  You have a fever or persistent symptoms for more than 2-3 days.  You have a fever and your symptoms suddenly get worse. These symptoms may represent a serious problem that is an emergency. Do not wait to see if the symptoms will go away. Get medical help right away. Call your local emergency services (911 in the [place] Do not drive yourself to the hospital. Summary  A pneumothorax, commonly called a collapsed lung, is a condition in which air leaks from a lung and gets trapped between the lung and the chest wall (pleural space).  The buildup of air may be small or large. A small pneumothorax may go away on its own. A large pneumothorax will require treatment and hospitalization.  Treatment for this condition depends on how severe the pneumothorax is. The goal of treatment is to remove the extra air and allow the lung to expand back to its normal size. This information is not intended to replace advice given to you by your health care provider. Make sure you discuss any questions you have with your health care provider. Document Released: 09/17/2005 Document Revised: 08/26/2017 Document Reviewed: 08/26/2017 Elsevier Interactive Patient Education  2019 [employer]
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
## 2019-02-16 NOTE — Progress Notes (Signed)
Discharge instructions given. Pt verbalizes understanding. 

## 2019-02-17 LAB — HIV ANTIBODY (ROUTINE TESTING W REFLEX): HIV Screen 4th Generation wRfx: NONREACTIVE

## 2019-02-20 ENCOUNTER — Ambulatory Visit
Admission: RE | Admit: 2019-02-20 | Discharge: 2019-02-20 | Disposition: A | Discharge status: Home / Self Care | Payer: Self-pay | Attending: Cardiothoracic Surgery | Admitting: Cardiothoracic Surgery

## 2019-02-20 ENCOUNTER — Ambulatory Visit
Admission: RE | Admit: 2019-02-20 | Discharge: 2019-02-20 | Disposition: A | Discharge status: Home / Self Care | Payer: MEDICAID | Attending: Cardiothoracic Surgery | Admitting: Cardiothoracic Surgery

## 2019-02-20 ENCOUNTER — Ambulatory Visit (INDEPENDENT_AMBULATORY_CARE_PROVIDER_SITE_OTHER): Payer: Self-pay | Admitting: Cardiothoracic Surgery

## 2019-02-20 ENCOUNTER — Encounter: Payer: Self-pay | Admitting: Cardiothoracic Surgery

## 2019-02-20 ENCOUNTER — Other Ambulatory Visit: Payer: Self-pay

## 2019-02-20 VITALS — BP 167/100 | HR 64 | Temp 97.7°F | Resp 14 | Ht 69.0 in | Wt 235.0 lb

## 2019-02-20 DIAGNOSIS — S2232XD Fracture of one rib, left side, subsequent encounter for fracture with routine healing: Secondary | ICD-10-CM | POA: Insufficient documentation

## 2019-02-20 DIAGNOSIS — S270XXD Traumatic pneumothorax, subsequent encounter: Secondary | ICD-10-CM

## 2019-02-20 DIAGNOSIS — I517 Cardiomegaly: Secondary | ICD-10-CM | POA: Insufficient documentation

## 2019-02-20 DIAGNOSIS — X58XXXD Exposure to other specified factors, subsequent encounter: Secondary | ICD-10-CM | POA: Insufficient documentation

## 2019-02-20 MED ORDER — OXYCODONE-ACETAMINOPHEN 5-325 MG PO TABS: 1.0000 | ORAL_TABLET | Freq: Four times a day (QID) | ORAL | 0 refills | Status: DC | PRN

## 2019-02-20 NOTE — Progress Notes (Signed)
  Patient ID: Kyle Mathis, male   DOB: 1971-03-15, 48 y.o.   MRN: [id number]  HISTORY: He returns to the office today.  He is now about a week out from his fall and rib fracture.  Does not complain of any significant shortness of breath although he does complain of pain with coughing.  He is not had any hemoptysis fevers or chills.  He did request a refill on his oral narcotics.  He is currently taking 5 mg of oxycodone.  I changed that to [facility] 02/01/2024 1 to 2 tablets as needed for pain.  He was supposed to have a chest x-ray made today but that has not yet taken place.  He will get that after our visit today.  I will then review that later this afternoon.  He will come back to the office to get that report.   Vitals:   02/20/19 1016  BP: (!) 167/100  Pulse: 64  Resp: 14  Temp: 97.7 F (36.5 C)  SpO2: 97%     EXAM:    Resp: Lungs are clear bilaterally.  No respiratory distress, normal effort. Heart:  Regular without murmurs Abd:  Abdomen is soft, non distended and non tender. No masses are palpable.  There is no rebound and no guarding.  There is some bruising just above the left lateral pelvic area consistent with his fall. Neurological: Alert and oriented to person, place, and time. Coordination normal.  Skin: Skin is warm and dry. No rash noted. No diaphoretic. No erythema. No pallor.  Psychiatric: Normal mood and affect. Normal behavior. Judgment and thought content normal.    ASSESSMENT: Rib fracture secondary to trauma.   PLAN:   He is asked for a refill on his narcotics which I provided.  We will get a chest x-ray today in follow-up.  We will also give him a letter for light duty for 2 weeks.    Hulda Marin, MD

## 2019-02-20 NOTE — Patient Instructions (Signed)
To have chest x-ray done today and return to office.

## 2019-02-26 ENCOUNTER — Other Ambulatory Visit: Payer: Self-pay

## 2019-02-26 DIAGNOSIS — S270XXD Traumatic pneumothorax, subsequent encounter: Secondary | ICD-10-CM

## 2019-03-06 ENCOUNTER — Ambulatory Visit
Admission: RE | Admit: 2019-03-06 | Discharge: 2019-03-06 | Disposition: A | Discharge status: Home / Self Care | Payer: BC Managed Care – PPO | Source: Ambulatory Visit | Attending: Cardiothoracic Surgery | Admitting: Cardiothoracic Surgery

## 2019-03-06 ENCOUNTER — Encounter: Payer: Self-pay | Admitting: Cardiothoracic Surgery

## 2019-03-06 ENCOUNTER — Other Ambulatory Visit: Payer: Self-pay

## 2019-03-06 ENCOUNTER — Ambulatory Visit (INDEPENDENT_AMBULATORY_CARE_PROVIDER_SITE_OTHER): Payer: Self-pay | Admitting: Cardiothoracic Surgery

## 2019-03-06 VITALS — BP 192/114 | HR 76 | Temp 97.8°F | Ht 69.0 in | Wt 231.0 lb

## 2019-03-06 DIAGNOSIS — S270XXD Traumatic pneumothorax, subsequent encounter: Secondary | ICD-10-CM | POA: Diagnosis not present

## 2019-03-06 MED ORDER — OXYCODONE-ACETAMINOPHEN 5-325 MG PO TABS: 1.0000 | ORAL_TABLET | Freq: Four times a day (QID) | ORAL | 0 refills | Status: DC | PRN

## 2019-03-06 NOTE — Progress Notes (Signed)
  Patient ID: Kyle Mathis, male   DOB: 1971-08-29, 48 y.o.   MRN: [id number]  HISTORY: He returns today in follow-up.  He states that the pain on his left side is improving but he has intense pain with coughing or sneezing.  This is however getting better.  He is not short of breath.  He is gone back to work part-time and would like to slowly increase his activities.   Vitals:   03/06/19 0937  BP: (!) 192/114  Pulse: 76  Temp: 97.8 F (36.6 C)  SpO2: 95%     EXAM:    Resp: Lungs are clear bilaterally.  No respiratory distress, normal effort. Heart:  Regular without murmurs Abd:  Abdomen is soft, non distended and non tender. No masses are palpable.  There is no rebound and no guarding.  Neurological: Alert and oriented to person, place, and time. Coordination normal.  Skin: Skin is warm and dry. No rash noted. No diaphoretic. No erythema. No pallor.  Psychiatric: Normal mood and affect. Normal behavior. Judgment and thought content normal.   He is tender to palpation along the posterior left upper chest.  ASSESSMENT: I have independently reviewed his chest x-ray.  There is some contusion of the chest wall was seen.  This is consistent with his rib fractures.  I do not see any pneumothorax or pleural effusion   PLAN:   I would like to see him back again in 6 weeks.  I told him that he could slowly increase his activities at work.  We will obtain a chest x-ray.  He did request a refill on his narcotics which I will provide to him.  Percocet 02/01/2024  1-2 tabs every 6 hours as needed dispense #20.    Hulda Marin, MD

## 2019-03-06 NOTE — Patient Instructions (Signed)
Return in six weeks chest x-ray

## 2019-04-10 ENCOUNTER — Ambulatory Visit (INDEPENDENT_AMBULATORY_CARE_PROVIDER_SITE_OTHER): Payer: BC Managed Care – PPO | Admitting: Cardiothoracic Surgery

## 2019-04-10 ENCOUNTER — Ambulatory Visit
Admission: RE | Admit: 2019-04-10 | Discharge: 2019-04-10 | Disposition: A | Discharge status: Home / Self Care | Payer: BC Managed Care – PPO | Attending: Cardiothoracic Surgery | Admitting: Cardiothoracic Surgery

## 2019-04-10 ENCOUNTER — Ambulatory Visit: Payer: Self-pay | Admitting: Cardiothoracic Surgery

## 2019-04-10 ENCOUNTER — Other Ambulatory Visit: Payer: Self-pay

## 2019-04-10 ENCOUNTER — Ambulatory Visit
Admission: RE | Admit: 2019-04-10 | Discharge: 2019-04-10 | Disposition: A | Discharge status: Home / Self Care | Payer: BC Managed Care – PPO | Source: Ambulatory Visit | Attending: Cardiothoracic Surgery | Admitting: Cardiothoracic Surgery

## 2019-04-10 VITALS — BP 179/102 | HR 81 | Temp 97.5°F | Ht 69.0 in | Wt 223.0 lb

## 2019-04-10 DIAGNOSIS — S270XXD Traumatic pneumothorax, subsequent encounter: Secondary | ICD-10-CM

## 2019-04-10 NOTE — Progress Notes (Signed)
  Patient ID: Kyle Mathis, male   DOB: July 20, 1971, 48 y.o.   MRN: [id number]  HISTORY: He comes in today without any specific problems.  He is not short of breath.  He has no chest pain.  He states that he is able to do pull-ups and push-ups.   Vitals:   04/10/19 0836  BP: (!) 179/102  Pulse: 81  Temp: (!) 97.5 F (36.4 C)  SpO2: 98%     EXAM:    Resp: Lungs are clear bilaterally.  No respiratory distress, normal effort. Heart:  Regular without murmurs Abd:  Abdomen is soft, non distended and non tender. No masses are palpable.  There is no rebound and no guarding.  Neurological: Alert and oriented to person, place, and time. Coordination normal.  Skin: Skin is warm and dry. No rash noted. No diaphoretic. No erythema. No pallor.  Psychiatric: Normal mood and affect. Normal behavior. Judgment and thought content normal.    ASSESSMENT: He did have a chest x-ray made today which have independently reviewed.  The chest wall hematoma appears to be less but there is an increase in the left costophrenic angle fluid.   PLAN:   Since he is asymptomatic from the pleural fluid I do not think that we should intervene at this time.  I will get a chest x-ray on him in 6 weeks time.  He will come back and see me at that point.    Nestor Lewandowsky, MD

## 2019-04-10 NOTE — Patient Instructions (Signed)
Return in 6 weeks for chest  -xay

## 2019-05-29 ENCOUNTER — Ambulatory Visit
Admission: RE | Admit: 2019-05-29 | Discharge: 2019-05-29 | Disposition: A | Discharge status: Home / Self Care | Payer: BC Managed Care – PPO | Source: Ambulatory Visit | Attending: Cardiothoracic Surgery | Admitting: Cardiothoracic Surgery

## 2019-05-29 ENCOUNTER — Ambulatory Visit (INDEPENDENT_AMBULATORY_CARE_PROVIDER_SITE_OTHER): Payer: BC Managed Care – PPO | Admitting: Cardiothoracic Surgery

## 2019-05-29 ENCOUNTER — Other Ambulatory Visit: Payer: Self-pay

## 2019-05-29 ENCOUNTER — Ambulatory Visit
Admission: RE | Admit: 2019-05-29 | Discharge: 2019-05-29 | Disposition: A | Discharge status: Home / Self Care | Payer: BC Managed Care – PPO | Attending: Cardiothoracic Surgery | Admitting: Cardiothoracic Surgery

## 2019-05-29 ENCOUNTER — Encounter: Payer: Self-pay | Admitting: Cardiothoracic Surgery

## 2019-05-29 VITALS — BP 169/99 | HR 70 | Temp 97.3°F | Ht 69.0 in | Wt 227.0 lb

## 2019-05-29 DIAGNOSIS — S270XXD Traumatic pneumothorax, subsequent encounter: Secondary | ICD-10-CM

## 2019-05-29 NOTE — Patient Instructions (Signed)
Please call with any questions or concerns. [facility]  Physician Assistant is accepting new patients.

## 2019-05-29 NOTE — Progress Notes (Signed)
  Patient ID: Kyle Mathis, male   DOB: Feb 05, 1971, 48 y.o.   MRN: [id number]  HISTORY: Overall he feels much better.  He states he is not short of breath.  He has no chest pain.   Vitals:   05/29/19 0836  BP: (!) 169/99  Pulse: 70  Temp: (!) 97.3 F (36.3 C)  SpO2: 98%     EXAM:    Resp: Lungs are clear bilaterally.  No respiratory distress, normal effort. Heart:  Regular without murmurs Abd:  Abdomen is soft, non distended and non tender. No masses are palpable.  There is no rebound and no guarding.  Neurological: Alert and oriented to person, place, and time. Coordination normal.  Skin: Skin is warm and dry. No rash noted. No diaphoretic. No erythema. No pallor.  Psychiatric: Normal mood and affect. Normal behavior. Judgment and thought content normal.    ASSESSMENT: He did have a chest x-ray made today.  Have independently reviewed that.  There is a minimal amount of pleural thickening.   PLAN:   I did not make a return appointment for him today.  I believe that he is now recovered from his rib fracture.    Nestor Lewandowsky, MD

## 2020-04-23 ENCOUNTER — Emergency Department (HOSPITAL_COMMUNITY): Payer: Worker's Compensation

## 2020-04-23 ENCOUNTER — Emergency Department (HOSPITAL_COMMUNITY)
Admission: EM | Admit: 2020-04-23 | Discharge: 2020-04-23 | Disposition: A | Discharge status: Home / Self Care | Payer: Worker's Compensation | Attending: Emergency Medicine | Admitting: Emergency Medicine

## 2020-04-23 ENCOUNTER — Encounter (HOSPITAL_COMMUNITY): Payer: Self-pay | Admitting: *Deleted

## 2020-04-23 ENCOUNTER — Other Ambulatory Visit: Payer: Self-pay

## 2020-04-23 DIAGNOSIS — W230XXA Caught, crushed, jammed, or pinched between moving objects, initial encounter: Secondary | ICD-10-CM | POA: Diagnosis not present

## 2020-04-23 DIAGNOSIS — Z87891 Personal history of nicotine dependence: Secondary | ICD-10-CM | POA: Insufficient documentation

## 2020-04-23 DIAGNOSIS — Y998 Other external cause status: Secondary | ICD-10-CM | POA: Insufficient documentation

## 2020-04-23 DIAGNOSIS — I10 Essential (primary) hypertension: Secondary | ICD-10-CM | POA: Diagnosis not present

## 2020-04-23 DIAGNOSIS — Y9289 Other specified places as the place of occurrence of the external cause: Secondary | ICD-10-CM | POA: Diagnosis not present

## 2020-04-23 DIAGNOSIS — S81812A Laceration without foreign body, left lower leg, initial encounter: Secondary | ICD-10-CM | POA: Diagnosis not present

## 2020-04-23 DIAGNOSIS — Y9389 Activity, other specified: Secondary | ICD-10-CM | POA: Insufficient documentation

## 2020-04-23 DIAGNOSIS — S81811A Laceration without foreign body, right lower leg, initial encounter: Secondary | ICD-10-CM | POA: Diagnosis present

## 2020-04-23 MED ORDER — BACITRACIN ZINC 500 UNIT/GM EX OINT
TOPICAL_OINTMENT | Freq: Once | CUTANEOUS | Status: AC
  Filled 2020-04-23: qty 0.9

## 2020-04-23 MED ORDER — LIDOCAINE-EPINEPHRINE (PF) 2 %-1:200000 IJ SOLN
10.0000 mL | Freq: Once | INTRAMUSCULAR | Status: AC
  Administered 2020-04-23: 10 mL via INTRADERMAL
  Filled 2020-04-23: qty 10

## 2020-04-23 MED ORDER — POVIDONE-IODINE 10 % EX SOLN
CUTANEOUS | Status: AC
  Filled 2020-04-23: qty 15

## 2020-04-23 MED ORDER — CEPHALEXIN 500 MG PO CAPS
500.0000 mg | ORAL_CAPSULE | Freq: Three times a day (TID) | ORAL | 0 refills | Status: AC
Start: 2020-04-23 — End: 2020-04-30

## 2020-04-23 NOTE — ED Triage Notes (Signed)
Pt states he was at work and lost his balance and his leg got cut by the belt of his machine

## 2020-04-23 NOTE — Discharge Instructions (Addendum)
Keep the wound clean and dry for the first 24 hours. After that you may gently clean the wound with soap and water. Make sure to pat dry the wound before covering it with any dressing. You can use topical antibiotic ointment and bandage. Ice and elevate for pain relief.  ° °You can take Tylenol or Ibuprofen as directed for pain. You can alternate Tylenol and Ibuprofen every 4 hours for additional pain relief.  ° °Return to the Emergency Department, your primary care doctor, or the [facility] in 7-10 days for suture removal.  ° °Monitor closely for any signs of infection. Return to the Emergency Department for any worsening redness/swelling of the area that begins to spread, drainage from the site, worsening pain, fever or any other worsening or concerning symptoms.  ° ° °

## 2020-04-23 NOTE — ED Provider Notes (Signed)
[facility] EMERGENCY DEPARTMENT Provider Note   CSN: [id number] Arrival date & time: 04/23/20  1046     History Chief Complaint  Patient presents with  . Extremity Laceration    Kyle Mathis is a 49 y.o. male who presents for evaluation of laceration noted to right lower extremity that occurred today at about 9:40 AM this morning.  He reports that he was at work and states that his leg got caught in a machine.  He states that it got caught in between the metal side in a conveyor belt causing a laceration to the lateral aspect of his distal right tib-fib.  He states he has been able to walk on the leg since this happened.  He was able to drive himself here to the emergency department.  His tetanus is in the last year.  He denies any numbness/weakness.  He is not on blood thinners.  The history is provided by the patient.       Past Medical History:  Diagnosis Date  . Hypertension   . Patient denies medical problems     Patient Active Problem List   Diagnosis Date Noted  . Rib fracture 02/15/2019  . Pneumothorax 02/15/2019    Past Surgical History:  Procedure Laterality Date  . NO PAST SURGERIES         History reviewed. No pertinent family history.  Social History   Tobacco Use  . Smoking status: Former Smoker    Types: Cigarettes  . Smokeless tobacco: Never Used  Substance Use Topics  . Alcohol use: No  . Drug use: No    Home Medications Prior to Admission medications   Medication Sig Start Date End Date Taking? Authorizing Provider  cephALEXin (KEFLEX) 500 MG capsule Take 1 capsule (500 mg total) by mouth 3 (three) times daily for 7 days. 04/23/20 04/30/20  Maxwell Caul, PA-C    Allergies    Patient has no known allergies.  Review of Systems   Review of Systems  Skin: Positive for wound.  Neurological: Negative for weakness and numbness.  All other systems reviewed and are negative.   Physical Exam Updated Vital Signs BP (!) 159/96 (BP  Location: Left Arm)   Pulse 69   Temp 99.1 F (37.3 C) (Oral)   Resp 19   Ht 5\' 9"  (1.753 m)   Wt (!) 105.7 kg   SpO2 100%   BMI 34.41 kg/m   Physical Exam Vitals and nursing note reviewed.  Constitutional:      Appearance: He is well-developed.  HENT:     Head: Normocephalic and atraumatic.  Eyes:     General: No scleral icterus.       Right eye: No discharge.        Left eye: No discharge.     Conjunctiva/sclera: Conjunctivae normal.  Cardiovascular:     Pulses:          Dorsalis pedis pulses are 2+ on the right side and 2+ on the left side.  Pulmonary:     Effort: Pulmonary effort is normal.  Musculoskeletal:     Comments: No bony deformity or crepitus noted to the left tib-fib.  Flexion/extension of right ankle intact any difficulty.  Skin:    General: Skin is warm and dry.          Comments: 3cm jagged linear laceration noted to the lateral aspect of the distal tib-fib.  There is another 2 cm jagged lateral laceration noted just distal.  It  does not extend over the ankle joint itself.  He has some slight tenderness around the area.  Neurological:     Mental Status: He is alert.  Psychiatric:        Speech: Speech normal.        Behavior: Behavior normal.     ED Results / Procedures / Treatments   Labs (all labs ordered are listed, but only abnormal results are displayed) Labs Reviewed - No data to display  EKG None  Radiology DG Tibia/Fibula Right  Result Date: 04/23/2020 CLINICAL DATA:  RIGHT distal tibial and fibular pain, laceration post fall EXAM: RIGHT TIBIA AND FIBULA - 2 VIEW COMPARISON:  None FINDINGS: Osseous mineralization normal. Joint spaces preserved. No acute fracture, dislocation, or bone destruction. IMPRESSION: Normal exam. Electronically Signed   By: Ulyses SouthwardMark  Boles M.D.   On: 04/23/2020 12:16    Procedures .Marland Kitchen.Laceration Repair  Date/Time: 04/23/2020 1:40 PM Performed by: Maxwell CaulLayden, Piccola Arico A, PA-C Authorized by: Maxwell CaulLayden, Shivank Pinedo A, PA-C    Consent:    Consent obtained:  Verbal   Consent given by:  Patient   Risks discussed:  Infection, need for additional repair, pain, poor cosmetic result and poor wound healing   Alternatives discussed:  No treatment and delayed treatment Universal protocol:    Procedure explained and questions answered to patient or proxy's satisfaction: yes     Relevant documents present and verified: yes     Test results available and properly labeled: yes     Imaging studies available: yes     Required blood products, implants, devices, and special equipment available: yes     Site/side marked: yes     Immediately prior to procedure, a time out was called: yes     Patient identity confirmed:  Verbally with patient Anesthesia (see MAR for exact dosages):    Anesthesia method:  Local infiltration   Local anesthetic:  Lidocaine 2% WITH epi Laceration details:    Location:  Leg   Leg location:  R lower leg   Length (cm):  3 Repair type:    Repair type:  Simple Pre-procedure details:    Preparation:  Patient was prepped and draped in usual sterile fashion Exploration:    Hemostasis achieved with:  Direct pressure   Wound exploration: wound explored through full range of motion     Wound extent: no foreign bodies/material noted   Treatment:    Area cleansed with:  Betadine   Amount of cleaning:  Extensive   Irrigation solution:  Sterile saline   Irrigation method:  Syringe   Visualized foreign bodies/material removed: no   Skin repair:    Repair method:  Sutures   Suture size:  4-0   Suture material:  Nylon   Suture technique:  Simple interrupted   Number of sutures:  5 Approximation:    Approximation:  Close Post-procedure details:    Dressing:  Antibiotic ointment   Patient tolerance of procedure:  Tolerated well, no immediate complications .Marland Kitchen.Laceration Repair  Date/Time: 04/23/2020 1:40 PM Performed by: Maxwell CaulLayden, Dominic Rhome A, PA-C Authorized by: Maxwell CaulLayden, Antwine Agosto A, PA-C   Consent:     Consent obtained:  Verbal   Consent given by:  Patient   Risks discussed:  Infection, need for additional repair, pain, poor cosmetic result and poor wound healing   Alternatives discussed:  No treatment and delayed treatment Universal protocol:    Procedure explained and questions answered to patient or proxy's satisfaction: yes     Relevant documents present and  verified: yes     Test results available and properly labeled: yes     Imaging studies available: yes     Required blood products, implants, devices, and special equipment available: yes     Site/side marked: yes     Immediately prior to procedure, a time out was called: yes     Patient identity confirmed:  Verbally with patient Anesthesia (see MAR for exact dosages):    Anesthesia method:  Local infiltration   Local anesthetic:  Lidocaine 2% WITH epi Laceration details:    Location:  Leg   Leg location:  R lower leg   Length (cm):  2 Repair type:    Repair type:  Simple Pre-procedure details:    Preparation:  Patient was prepped and draped in usual sterile fashion Exploration:    Hemostasis achieved with:  Direct pressure   Wound exploration: wound explored through full range of motion     Wound extent: no foreign bodies/material noted   Treatment:    Area cleansed with:  Betadine   Amount of cleaning:  Standard   Irrigation solution:  Sterile saline   Irrigation method:  Syringe   Visualized foreign bodies/material removed: no   Skin repair:    Repair method:  Sutures   Suture size:  4-0   Suture material:  Nylon   Suture technique:  Simple interrupted   Number of sutures:  3 Approximation:    Approximation:  Close Post-procedure details:    Dressing:  Antibiotic ointment and non-adherent dressing   Patient tolerance of procedure:  Tolerated well, no immediate complications Comments:     Once wound is anesthetized, was irrigated with sterile water.  No evidence of foreign body.  Laceration itself was very  jagged and irregular.  Towards the more medial aspect, he was had a skin avulsion with no skin that could be close together.  The area was approximated as best as possible.   (including critical care time)  Medications Ordered in ED Medications  lidocaine-EPINEPHrine (XYLOCAINE W/EPI) 2 %-1:200000 (PF) injection 10 mL (10 mLs Intradermal Given 04/23/20 1343)  povidone-iodine (BETADINE) 10 % external solution (  Given 04/23/20 1343)  bacitracin ointment ( Topical Given 04/23/20 1349)    ED Course  I have reviewed the triage vital signs and the nursing notes.  Pertinent labs & imaging results that were available during my care of the patient were reviewed by me and considered in my medical decision making (see chart for details).    MDM Rules/Calculators/A&P                          49 year old male who presents for evaluation of laceration left lower extremity that occurred earlier this morning at 9:40 AM.  He reports that his leg got caught a machine at work.  His tetanus is up-to-date.  Initially arrival, he is afebrile, nontoxic-appearing.  Vital signs are stable.  He is neurovascularly intact.  He has 2 lacerations in the lateral distal tib-fib.  We will plan for x-ray to ensure no acute bony abnormality given that this was machine.  Plan for wound care, repair.  His tetanus is up-to-date.  XR reviewed negative for any acute bony abnormality.   Laceration repaired as documented above.  Patient tolerated procedure well.  Given that this was a machine plan for antibiotics.  Patient with no known drug allergies. At this time, patient exhibits no emergent life-threatening condition that require further evaluation in  ED or admission. Patient had ample opportunity for questions and discussion. All patient's questions were answered with full understanding. Strict return precautions discussed. Patient expresses understanding and agreement to plan.   Portions of this note were generated with Administrator, sports. Dictation errors may occur despite best attempts at proofreading.    Final Clinical Impression(s) / ED Diagnoses Final diagnoses:  Laceration of left lower extremity, initial encounter    Rx / DC Orders ED Discharge Orders         Ordered    cephALEXin (KEFLEX) 500 MG capsule  3 times daily     Discontinue  Reprint     04/23/20 1344           Rosana Hoes 04/23/20 1503    Bethann Berkshire, MD 04/24/20 [phone]

## 2020-05-01 ENCOUNTER — Encounter (HOSPITAL_COMMUNITY): Payer: Self-pay | Admitting: Emergency Medicine

## 2020-05-01 ENCOUNTER — Other Ambulatory Visit: Payer: Self-pay

## 2020-05-01 ENCOUNTER — Emergency Department (HOSPITAL_COMMUNITY)
Admission: EM | Admit: 2020-05-01 | Discharge: 2020-05-02 | Disposition: A | Discharge status: Home / Self Care | Payer: Worker's Compensation | Attending: Emergency Medicine | Admitting: Emergency Medicine

## 2020-05-01 DIAGNOSIS — F17210 Nicotine dependence, cigarettes, uncomplicated: Secondary | ICD-10-CM | POA: Diagnosis not present

## 2020-05-01 DIAGNOSIS — I10 Essential (primary) hypertension: Secondary | ICD-10-CM | POA: Diagnosis not present

## 2020-05-01 DIAGNOSIS — Z4802 Encounter for removal of sutures: Secondary | ICD-10-CM | POA: Diagnosis not present

## 2020-05-01 NOTE — ED Triage Notes (Signed)
Pt is here for suture removal on his right lower leg.

## 2020-05-02 NOTE — ED Provider Notes (Signed)
  [facility] EMERGENCY DEPARTMENT Provider Note   CSN: [id number] Arrival date & time: 05/01/20  2145   History Chief Complaint  Patient presents with  . Suture / Staple Removal    Kyle Mathis is a 49 y.o. male.  The history is provided by the patient.  Suture / Staple Removal  He has history of hypertension and comes in for suture removal.  He was in the emergency department on 7/24 for laceration of right lower leg with sutures being placed.  He comes in for routine suture removal.  Past Medical History:  Diagnosis Date  . Hypertension   . Patient denies medical problems     Patient Active Problem List   Diagnosis Date Noted  . Rib fracture 02/15/2019  . Pneumothorax 02/15/2019    Past Surgical History:  Procedure Laterality Date  . NO PAST SURGERIES         History reviewed. No pertinent family history.  Social History   Tobacco Use  . Smoking status: Former Smoker    Types: Cigarettes  . Smokeless tobacco: Never Used  Substance Use Topics  . Alcohol use: No  . Drug use: No    Home Medications Prior to Admission medications   Not on File    Allergies    Patient has no known allergies.  Review of Systems   Review of Systems  All other systems reviewed and are negative.   Physical Exam Updated Vital Signs BP (!) 159/83 (BP Location: Right Arm)   Pulse 75   Temp 98.1 F (36.7 C) (Oral)   Resp 17   Ht 5\' 9"  (1.753 m)   Wt (!) 105.7 kg   SpO2 100%   BMI 34.41 kg/m   Physical Exam Vitals and nursing note reviewed.   49 year old male, resting comfortably and in no acute distress. Vital signs are significant for elevated blood pressure. Oxygen saturation is 100%, which is normal. Head is normocephalic and atraumatic. PERRLA, EOMI. Oropharynx is clear. Neck is nontender and supple without adenopathy or JVD. Back is nontender and there is no CVA tenderness. Lungs are clear without rales, wheezes, or rhonchi. Chest is nontender. Heart  has regular rate and rhythm without murmur. Abdomen is soft, flat, nontender without masses or hepatosplenomegaly and peristalsis is normoactive. Extremities: Laceration of right lower leg is well-healed.  Sutures have been removed. Skin is warm and dry without rash. Neurologic: Mental status is normal, cranial nerves are intact, there are no motor or sensory deficits.  ED Results / Procedures / Treatments    Procedures Procedures  Medications Ordered in ED Medications - No data to display  ED Course  I have reviewed the triage vital signs and the nursing notes.  MDM Rules/Calculators/A&P Encounter for suture removal.  Sutures been removed.  No sign of infection.  He is noted to be hypertensive and also was hypertensive when in the emergency for placement of sutures.  Advised to monitor his blood pressure as an outpatient, might need to adjust his antihypertensive medication regimen if he continues to be hypertensive.  Final Clinical Impression(s) / ED Diagnoses Final diagnoses:  Encounter for removal of sutures  Elevated blood pressure reading with diagnosis of hypertension    Rx / DC Orders ED Discharge Orders    None       54, MD 05/02/20 [phone]

## 2020-05-02 NOTE — Discharge Instructions (Signed)
Your blood pressure was a little high today, and also on the day you came in for the stitches.  Please monitor your blood pressure at home.  If it is staying high, you will need to adjust your blood pressure medications.
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
## 2022-01-19 ENCOUNTER — Other Ambulatory Visit: Payer: Self-pay

## 2022-01-19 ENCOUNTER — Emergency Department: Payer: Self-pay

## 2022-01-19 ENCOUNTER — Emergency Department
Admission: EM | Admit: 2022-01-19 | Discharge: 2022-01-19 | Payer: Self-pay | Attending: Emergency Medicine | Admitting: Emergency Medicine

## 2022-01-19 ENCOUNTER — Encounter: Payer: Self-pay | Admitting: Emergency Medicine

## 2022-01-19 DIAGNOSIS — R1011 Right upper quadrant pain: Secondary | ICD-10-CM | POA: Insufficient documentation

## 2022-01-19 DIAGNOSIS — R0789 Other chest pain: Secondary | ICD-10-CM | POA: Insufficient documentation

## 2022-01-19 DIAGNOSIS — I10 Essential (primary) hypertension: Secondary | ICD-10-CM | POA: Insufficient documentation

## 2022-01-19 DIAGNOSIS — D72829 Elevated white blood cell count, unspecified: Secondary | ICD-10-CM | POA: Insufficient documentation

## 2022-01-19 DIAGNOSIS — M542 Cervicalgia: Secondary | ICD-10-CM | POA: Insufficient documentation

## 2022-01-19 DIAGNOSIS — E876 Hypokalemia: Secondary | ICD-10-CM | POA: Insufficient documentation

## 2022-01-19 DIAGNOSIS — M549 Dorsalgia, unspecified: Secondary | ICD-10-CM | POA: Insufficient documentation

## 2022-01-19 DIAGNOSIS — T07XXXA Unspecified multiple injuries, initial encounter: Secondary | ICD-10-CM

## 2022-01-19 DIAGNOSIS — Y92149 Unspecified place in prison as the place of occurrence of the external cause: Secondary | ICD-10-CM | POA: Insufficient documentation

## 2022-01-19 DIAGNOSIS — M79641 Pain in right hand: Secondary | ICD-10-CM | POA: Insufficient documentation

## 2022-01-19 DIAGNOSIS — S0083XA Contusion of other part of head, initial encounter: Secondary | ICD-10-CM | POA: Insufficient documentation

## 2022-01-19 DIAGNOSIS — S069X9A Unspecified intracranial injury with loss of consciousness of unspecified duration, initial encounter: Secondary | ICD-10-CM

## 2022-01-19 DIAGNOSIS — R944 Abnormal results of kidney function studies: Secondary | ICD-10-CM | POA: Insufficient documentation

## 2022-01-19 DIAGNOSIS — S069XAA Unspecified intracranial injury with loss of consciousness status unknown, initial encounter: Secondary | ICD-10-CM | POA: Insufficient documentation

## 2022-01-19 LAB — CBC
HCT: 41.8 % (ref 39.0–52.0)
Hemoglobin: 13.9 g/dL (ref 13.0–17.0)
MCH: 31.6 pg (ref 26.0–34.0)
MCHC: 33.3 g/dL (ref 30.0–36.0)
MCV: 95 fL (ref 80.0–100.0)
Platelets: 265 10*3/uL (ref 150–400)
RBC: 4.4 MIL/uL (ref 4.22–5.81)
RDW: 12 % (ref 11.5–15.5)
WBC: 15.3 10*3/uL — ABNORMAL HIGH (ref 4.0–10.5)
nRBC: 0.2 % (ref 0.0–0.2)

## 2022-01-19 LAB — URINALYSIS, ROUTINE W REFLEX MICROSCOPIC
Bacteria, UA: NONE SEEN
Bilirubin Urine: NEGATIVE
Glucose, UA: NEGATIVE mg/dL
Hgb urine dipstick: NEGATIVE
Ketones, ur: NEGATIVE mg/dL
Leukocytes,Ua: NEGATIVE
Nitrite: NEGATIVE
Protein, ur: 100 mg/dL — AB
Specific Gravity, Urine: 1.026 (ref 1.005–1.030)
pH: 5 (ref 5.0–8.0)

## 2022-01-19 LAB — COMPREHENSIVE METABOLIC PANEL
ALT: 24 U/L (ref 0–44)
AST: 37 U/L (ref 15–41)
Albumin: 4.4 g/dL (ref 3.5–5.0)
Alkaline Phosphatase: 69 U/L (ref 38–126)
Anion gap: 10 (ref 5–15)
BUN: 10 mg/dL (ref 6–20)
CO2: 26 mmol/L (ref 22–32)
Calcium: 9.6 mg/dL (ref 8.9–10.3)
Chloride: 103 mmol/L (ref 98–111)
Creatinine, Ser: 1.35 mg/dL — ABNORMAL HIGH (ref 0.61–1.24)
GFR, Estimated: >60 mL/min (ref >60)
Glucose, Bld: 118 mg/dL — ABNORMAL HIGH (ref 70–99)
Potassium: 3.3 mmol/L — ABNORMAL LOW (ref 3.5–5.1)
Sodium: 139 mmol/L (ref 135–145)
Total Bilirubin: 0.8 mg/dL (ref 0.3–1.2)
Total Protein: 8.3 g/dL — ABNORMAL HIGH (ref 6.5–8.1)

## 2022-01-19 LAB — LIPASE, BLOOD: Lipase: 37 U/L (ref 11–51)

## 2022-01-19 MED ORDER — IOHEXOL 300 MG/ML  SOLN
100.0000 mL | Freq: Once | INTRAMUSCULAR | Status: AC | PRN
  Administered 2022-01-19: 100 mL via INTRAVENOUS

## 2022-01-19 MED ORDER — AMLODIPINE BESYLATE 5 MG PO TABS
5.0000 mg | ORAL_TABLET | Freq: Once | ORAL | Status: AC
  Administered 2022-01-19: 5 mg via ORAL
  Filled 2022-01-19: qty 1

## 2022-01-19 MED ORDER — ONDANSETRON HCL 4 MG/2ML IJ SOLN
4.0000 mg | Freq: Once | INTRAMUSCULAR | Status: AC
  Administered 2022-01-19: 4 mg via INTRAVENOUS
  Filled 2022-01-19: qty 2

## 2022-01-19 MED ORDER — MORPHINE SULFATE (PF) 4 MG/ML IV SOLN
4.0000 mg | Freq: Once | INTRAVENOUS | Status: AC
  Administered 2022-01-19: 4 mg via INTRAVENOUS
  Filled 2022-01-19: qty 1

## 2022-01-19 MED ORDER — POTASSIUM CHLORIDE CRYS ER 20 MEQ PO TBCR
40.0000 meq | EXTENDED_RELEASE_TABLET | Freq: Once | ORAL | Status: AC
  Administered 2022-01-19: 40 meq via ORAL
  Filled 2022-01-19: qty 2

## 2022-01-19 MED ORDER — SODIUM CHLORIDE 0.9 % IV BOLUS (SEPSIS)
1000.0000 mL | Freq: Once | INTRAVENOUS | Status: AC
  Administered 2022-01-19: 1000 mL via INTRAVENOUS

## 2022-01-19 MED ORDER — AMLODIPINE BESYLATE 5 MG PO TABS: 5.0000 mg | ORAL_TABLET | Freq: Every day | ORAL | 0 refills | Status: AC

## 2022-01-19 MED ORDER — IBUPROFEN 800 MG PO TABS: 800.0000 mg | ORAL_TABLET | Freq: Three times a day (TID) | ORAL | 0 refills | Status: AC | PRN

## 2022-01-19 NOTE — Discharge Instructions (Addendum)
You may alternate Tylenol 1000 mg every 6 hours as needed for pain, fever and Ibuprofen 800 mg every 6-8 hours as needed for pain, fever.  Please take Ibuprofen with food.  Do not take more than 4000 mg of Tylenol (acetaminophen) in a 24 hour period.  Steps to find a Primary Care Provider (PCP):  Call [phone] or [phone] to access "[facility] Find a Doctor Service."  2.  You may also go on the [facility] website at [email or web address]  

## 2022-01-19 NOTE — ED Notes (Signed)
Patient transported to CT 
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
## 2022-02-18 ENCOUNTER — Emergency Department
Admission: EM | Admit: 2022-02-18 | Discharge: 2022-02-18 | Disposition: A | Discharge status: Home / Self Care | Payer: No Typology Code available for payment source | Attending: Emergency Medicine | Admitting: Emergency Medicine

## 2022-02-18 ENCOUNTER — Other Ambulatory Visit: Payer: Self-pay

## 2022-02-18 DIAGNOSIS — M545 Low back pain, unspecified: Secondary | ICD-10-CM | POA: Insufficient documentation

## 2022-02-18 DIAGNOSIS — Y92410 Unspecified street and highway as the place of occurrence of the external cause: Secondary | ICD-10-CM | POA: Insufficient documentation

## 2022-02-18 MED ORDER — KETOROLAC TROMETHAMINE 15 MG/ML IJ SOLN
15.0000 mg | Freq: Once | INTRAMUSCULAR | Status: AC
  Administered 2022-02-18: 15 mg via INTRAMUSCULAR
  Filled 2022-02-18: qty 1

## 2022-02-18 MED ORDER — LIDOCAINE 5 % EX PTCH
1.0000 | MEDICATED_PATCH | CUTANEOUS | Status: DC
  Administered 2022-02-18: 1 via TRANSDERMAL
  Filled 2022-02-18: qty 1

## 2022-02-18 MED ORDER — LIDOCAINE 5 % EX PTCH: 1.0000 | MEDICATED_PATCH | Freq: Two times a day (BID) | CUTANEOUS | 0 refills | Status: AC

## 2022-02-18 MED ORDER — ACETAMINOPHEN 325 MG PO TABS
650.0000 mg | ORAL_TABLET | Freq: Once | ORAL | Status: AC
  Administered 2022-02-18: 650 mg via ORAL
  Filled 2022-02-18: qty 2
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
## 2022-02-18 NOTE — Discharge Instructions (Signed)
You may continue to take Tylenol/ibuprofen per package instructions as needed for pain.  You may also use the Lidoderm patches.  Please return to the emergency department for any new, worsening, or changing symptoms or other concerns including weakness in your legs, urinary or stool incontinence or retention, numbness or tingling in your extremities/buttocks/groin, fevers, or any other concerns or change in symptoms.  It was a pleasure caring for you today.
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
## 2023-10-12 ENCOUNTER — Emergency Department: Payer: Self-pay

## 2023-10-12 ENCOUNTER — Other Ambulatory Visit: Payer: Self-pay

## 2023-10-12 ENCOUNTER — Emergency Department
Admission: EM | Admit: 2023-10-12 | Discharge: 2023-10-12 | Disposition: A | Discharge status: Home / Self Care | Payer: Self-pay | Attending: Emergency Medicine | Admitting: Emergency Medicine

## 2023-10-12 DIAGNOSIS — S20212A Contusion of left front wall of thorax, initial encounter: Secondary | ICD-10-CM | POA: Insufficient documentation

## 2023-10-12 DIAGNOSIS — Y9355 Activity, bike riding: Secondary | ICD-10-CM | POA: Insufficient documentation

## 2023-10-12 MED ORDER — KETOROLAC TROMETHAMINE 15 MG/ML IJ SOLN
15.0000 mg | Freq: Once | INTRAMUSCULAR | Status: AC
  Administered 2023-10-12: 15 mg via INTRAMUSCULAR
  Filled 2023-10-12: qty 1

## 2023-10-12 MED ORDER — LIDOCAINE 5 % EX PTCH
1.0000 | MEDICATED_PATCH | CUTANEOUS | Status: DC
Start: 2023-10-12 — End: 2023-10-12
  Administered 2023-10-12: 1 via TRANSDERMAL
  Filled 2023-10-12: qty 1

## 2023-10-12 MED ORDER — LIDOCAINE 5 % EX PTCH: 1.0000 | MEDICATED_PATCH | Freq: Two times a day (BID) | CUTANEOUS | 0 refills | Status: AC

## 2023-10-12 MED ORDER — NAPROXEN 500 MG PO TABS: 500.0000 mg | ORAL_TABLET | Freq: Two times a day (BID) | ORAL | 0 refills | Status: AC
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
## 2023-10-12 NOTE — Discharge Instructions (Signed)
 Your x-ray was normal.  You may take the medications to help with your symptoms.  Please return for any new, worsening, or change in symptoms or other concerns.  It was a pleasure caring for you today.

## 2023-11-11 ENCOUNTER — Ambulatory Visit: Payer: Self-pay

## 2023-11-11 DIAGNOSIS — Z202 Contact with and (suspected) exposure to infections with a predominantly sexual mode of transmission: Secondary | ICD-10-CM

## 2023-11-11 MED ORDER — METRONIDAZOLE 500 MG PO TABS: 2000.0000 mg | ORAL_TABLET | Freq: Once | ORAL | Status: AC

## 2023-11-11 NOTE — Progress Notes (Signed)
 Pt here for treatment as contact to [facility].  Declines other STI screening.  Metronidazole  500mg  #4 dispensed to patient.  Counseled on medication, side effects, plan of care and when to contact clinic for questions or concerns.  Verbalizes understanding.  Condoms given.-Devi Hopman, RN
